# Patient Record
Sex: Female | Born: 1990
Health system: Southern US, Community
[De-identification: ages and names within clinical notes are randomized; demographics above are authoritative.]

## PROBLEM LIST (undated history)

## (undated) DIAGNOSIS — B009 Herpesviral infection, unspecified: Secondary | ICD-10-CM

## (undated) DIAGNOSIS — R011 Cardiac murmur, unspecified: Secondary | ICD-10-CM

## (undated) DIAGNOSIS — N946 Dysmenorrhea, unspecified: Secondary | ICD-10-CM

## (undated) DIAGNOSIS — F419 Anxiety disorder, unspecified: Secondary | ICD-10-CM

## (undated) DIAGNOSIS — D259 Leiomyoma of uterus, unspecified: Secondary | ICD-10-CM

## (undated) DIAGNOSIS — N939 Abnormal uterine and vaginal bleeding, unspecified: Secondary | ICD-10-CM

## (undated) DIAGNOSIS — R7989 Other specified abnormal findings of blood chemistry: Secondary | ICD-10-CM

## (undated) DIAGNOSIS — F32A Depression, unspecified: Secondary | ICD-10-CM

## (undated) DIAGNOSIS — F329 Major depressive disorder, single episode, unspecified: Secondary | ICD-10-CM

## (undated) DIAGNOSIS — R17 Unspecified jaundice: Secondary | ICD-10-CM

## (undated) HISTORY — DX: Dysmenorrhea, unspecified: N94.6

## (undated) HISTORY — DX: Major depressive disorder, single episode, unspecified: F32.9

## (undated) HISTORY — DX: Other specified abnormal findings of blood chemistry: R79.89

## (undated) HISTORY — DX: Abnormal uterine and vaginal bleeding, unspecified: N93.9

## (undated) HISTORY — DX: Depression, unspecified: F32.A

## (undated) HISTORY — PX: NO PAST SURGERIES: SHX2092

## (undated) HISTORY — DX: Herpesviral infection, unspecified: B00.9

## (undated) HISTORY — DX: Unspecified jaundice: R17

## (undated) HISTORY — DX: Cardiac murmur, unspecified: R01.1

## (undated) HISTORY — DX: Anxiety disorder, unspecified: F41.9

## (undated) HISTORY — DX: Leiomyoma of uterus, unspecified: D25.9

---

## 2018-01-19 ENCOUNTER — Ambulatory Visit (INDEPENDENT_AMBULATORY_CARE_PROVIDER_SITE_OTHER): Payer: BLUE CROSS/BLUE SHIELD | Admitting: Obstetrics and Gynecology

## 2018-01-19 ENCOUNTER — Other Ambulatory Visit: Payer: Self-pay

## 2018-01-19 ENCOUNTER — Encounter: Payer: Self-pay | Admitting: Obstetrics and Gynecology

## 2018-01-19 VITALS — BP 108/70 | HR 84 | Resp 18 | Ht 67.0 in | Wt 134.4 lb

## 2018-01-19 DIAGNOSIS — Z113 Encounter for screening for infections with a predominantly sexual mode of transmission: Secondary | ICD-10-CM

## 2018-01-19 DIAGNOSIS — N946 Dysmenorrhea, unspecified: Secondary | ICD-10-CM

## 2018-01-19 DIAGNOSIS — N941 Unspecified dyspareunia: Secondary | ICD-10-CM | POA: Diagnosis not present

## 2018-01-19 DIAGNOSIS — Z01419 Encounter for gynecological examination (general) (routine) without abnormal findings: Secondary | ICD-10-CM | POA: Diagnosis not present

## 2018-01-19 LAB — POCT URINE PREGNANCY: Preg Test, Ur: NEGATIVE

## 2018-01-19 MED ORDER — NORETHIN ACE-ETH ESTRAD-FE 1-20 MG-MCG PO TABS: 1.0000 | ORAL_TABLET | Freq: Every day | ORAL | 3 refills | Status: DC

## 2018-01-19 NOTE — Patient Instructions (Signed)
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
## 2018-01-19 NOTE — Addendum Note (Signed)
Addended by: Yates Decamp on: 01/19/2018 09:47 AM   Modules accepted: Orders

## 2018-01-22 LAB — HEP, RPR, HIV PANEL
HEP B S AG: NEGATIVE
HIV SCREEN 4TH GENERATION: NONREACTIVE
RPR Ser Ql: NONREACTIVE

## 2018-01-22 LAB — HSV(HERPES SIMPLEX VRS) I + II AB-IGM

## 2018-01-22 LAB — HEPATITIS C ANTIBODY

## 2018-01-24 ENCOUNTER — Telehealth: Payer: Self-pay | Admitting: Obstetrics and Gynecology

## 2018-01-24 NOTE — Telephone Encounter (Signed)
Call placed to convey benefits UNABLE to leave message voicemail not working.

## 2018-01-24 NOTE — Telephone Encounter (Signed)
Ok to wait until next year to do the pelvic ultrasound which is being done for pain reasons.  Patient choice.

## 2018-01-24 NOTE — Telephone Encounter (Signed)
Spoke with patient in regards to ultrasound benefits. Patient is scheduled for pap on 01/31/18. Patient has requested to defer ultrasound until after the new year. Routing to Dr. Quincy Simmonds.

## 2018-01-31 ENCOUNTER — Ambulatory Visit: Payer: BLUE CROSS/BLUE SHIELD | Admitting: Obstetrics and Gynecology

## 2018-02-01 ENCOUNTER — Telehealth: Payer: Self-pay | Admitting: *Deleted

## 2018-02-01 NOTE — Telephone Encounter (Signed)
-----   Message from Nunzio Cobbs, MD sent at 01/26/2018  6:38 AM EST ----- Please contact patient with results.  Testing is negative for HSV I/II IgM (meaning no recent HSV exposure), HIV, hep B and C. I have asked the lab if HSV I and II IgG testing can be done which would check for prior infection to these.

## 2018-02-01 NOTE — Telephone Encounter (Signed)
Notes recorded by Burnice Logan, RN on 02/01/2018 at 9:13 AM EST Left message to call Sharee Pimple, RN at [facility].    Patient is returning for pap on 02/07/18, can have HSV labs drawn at that time. ------  Notes recorded by Burnice Logan, RN on 01/30/2018 at 2:26 PM EST Confirmed with lab, unable to add labs. Message to Dr. Quincy Simmonds to advise.

## 2018-02-01 NOTE — Telephone Encounter (Signed)
Patient returning Jill's call.  °

## 2018-02-01 NOTE — Telephone Encounter (Signed)
Spoke with patient and advised of results.  Unable to process other labs for HSV IgG testing.  She is coming for pap on 11/27 and aware can have blood work drawn then.  Pt agreeable.  Encounter closed.

## 2018-02-07 ENCOUNTER — Other Ambulatory Visit: Payer: Self-pay

## 2018-02-07 ENCOUNTER — Encounter: Payer: Self-pay | Admitting: Obstetrics and Gynecology

## 2018-02-07 ENCOUNTER — Ambulatory Visit (INDEPENDENT_AMBULATORY_CARE_PROVIDER_SITE_OTHER): Payer: BLUE CROSS/BLUE SHIELD | Admitting: Obstetrics and Gynecology

## 2018-02-07 ENCOUNTER — Other Ambulatory Visit (HOSPITAL_COMMUNITY)
Admission: RE | Admit: 2018-02-07 | Discharge: 2018-02-07 | Disposition: A | Discharge status: Home / Self Care | Payer: BLUE CROSS/BLUE SHIELD | Source: Ambulatory Visit | Attending: Obstetrics and Gynecology | Admitting: Obstetrics and Gynecology

## 2018-02-07 ENCOUNTER — Other Ambulatory Visit: Payer: Self-pay | Admitting: Obstetrics and Gynecology

## 2018-02-07 VITALS — BP 120/80 | HR 80 | Ht 67.0 in | Wt 136.0 lb

## 2018-02-07 DIAGNOSIS — Z124 Encounter for screening for malignant neoplasm of cervix: Secondary | ICD-10-CM

## 2018-02-07 DIAGNOSIS — Z113 Encounter for screening for infections with a predominantly sexual mode of transmission: Secondary | ICD-10-CM | POA: Diagnosis not present

## 2018-02-07 DIAGNOSIS — N760 Acute vaginitis: Secondary | ICD-10-CM | POA: Insufficient documentation

## 2018-02-07 NOTE — Progress Notes (Signed)
GYNECOLOGY  VISIT   HPI: 27 y.o.   Single  African American  female   [place] with Patient's last menstrual period was 01/24/2018 (approximate).   here for pap smear and labs.   Pap not done at annual exam due to her menstrual bleeding.  Concerned about yeast infection.  States she has this tendency.  Some vulvar irritation with toilet tissue at work.   Just started her birth control pills last Sunday.  Emotions fluctuating.  HA last week, resolved.   Hx HSV 2 in past but patient would like confirmation of this.  IgG testing was not done from her serum STD testing at her annual exam.  GYNECOLOGIC HISTORY: Patient's last menstrual period was 01/24/2018 (approximate). Contraception: OCPs Menopausal hormone therapy:  n/a Last mammogram:  n/a Last pap smear:   2017/2018 normal per patient        OB History    Gravida  0   Para  0   Term  0   Preterm  0   AB  0   Living  0     SAB  0   TAB  0   Ectopic  0   Multiple  0   Live Births  0              There are no active problems to display for this patient.   Past Medical History:  Diagnosis Date  . Abnormal uterine bleeding   . Anxiety   . Depression   . Dysmenorrhea   . Heart murmur     No past surgical history on file.  Current Outpatient Medications  Medication Sig Dispense Refill  . norethindrone-ethinyl estradiol (JUNEL FE,GILDESS FE,LOESTRIN FE) 1-20 MG-MCG tablet Take 1 tablet by mouth daily. 3 Package 3   No current facility-administered medications for this visit.      ALLERGIES: Lactose intolerance (gi)  Family History  Problem Relation Age of Onset  . Hypertension Father     Social History   Socioeconomic History  . Marital status: Single    Spouse name: Not on file  . Number of children: Not on file  . Years of education: Not on file  . Highest education level: Not on file  Occupational History  . Not on file  Social Needs  . Financial resource strain: Not on file  .  Food insecurity:    Worry: Not on file    Inability: Not on file  . Transportation needs:    Medical: Not on file    Non-medical: Not on file  Tobacco Use  . Smoking status: Never Smoker  . Smokeless tobacco: Never Used  Substance and Sexual Activity  . Alcohol use: Yes    Comment: occ glass of wine  . Drug use: Never  . Sexual activity: Yes    Birth control/protection: Pill    Comment: Orthotricyclen Lo  Lifestyle  . Physical activity:    Days per week: Not on file    Minutes per session: Not on file  . Stress: Not on file  Relationships  . Social connections:    Talks on phone: Not on file    Gets together: Not on file    Attends religious service: Not on file    Active member of club or organization: Not on file    Attends meetings of clubs or organizations: Not on file    Relationship status: Not on file  . Intimate partner violence:    Fear of current or  ex partner: Not on file    Emotionally abused: Not on file    Physically abused: Not on file    Forced sexual activity: Not on file  Other Topics Concern  . Not on file  Social History Narrative  . Not on file    Review of Systems  Gastrointestinal: Positive for constipation.  Neurological: Positive for headaches.  All other systems reviewed and are negative.   PHYSICAL EXAMINATION:    BP 120/80 (BP Location: Right Arm, Patient Position: Sitting, Cuff Size: Normal)   Pulse 80   Ht 5\' 7"  (1.702 m)   Wt 136 lb (61.7 kg)   LMP 01/24/2018 (Approximate)   BMI 21.30 kg/m     General appearance: alert, cooperative and appears stated age  Pelvic: External genitalia:  no lesions              Urethra:  normal appearing urethra with no masses, tenderness or lesions              Bartholins and Skenes: normal                 Vagina: normal appearing vagina with normal color and discharge, no lesions              Cervix: no lesions.  Pap collected.                Bimanual Exam:  Uterus:  normal size, contour,  position, consistency, mobility, non-tender              Adnexa: no mass, fullness, tenderness                 Chaperone was present for exam.  ASSESSMENT  Vulvovaginitis.  Cervical cancer screening.  STD screening for HSV I and II IgG.  Newly started on Loestrin 1/20 to lower her estrogen dosage due to nausea on prior COC.   PLAN  Pap and HPV reflex testing.  GC/CT/trich/BV, yeast testing from pap.  HSV I and II IgG testing.  If symptoms of HA and emotional changes persist, may switch to a progesterone only contraceptive or a nonhormonal method.   An After Visit Summary was printed and given to the patient.  __15____ minutes face to face time of which over 50% was spent in counseling.

## 2018-02-09 ENCOUNTER — Encounter: Payer: Self-pay | Admitting: Obstetrics and Gynecology

## 2018-02-09 LAB — HSV(HERPES SIMPLEX VRS) I + II AB-IGG
HSV 1 GLYCOPROTEIN G AB, IGG: 13.5 {index} — AB (ref 0.00–0.90)
HSV 2 IgG, Type Spec: <0.91 index (ref 0.00–0.90)

## 2018-02-13 LAB — CYTOLOGY - PAP
Bacterial vaginitis: NEGATIVE
CANDIDA VAGINITIS: POSITIVE — AB
Chlamydia: NEGATIVE
DIAGNOSIS: NEGATIVE
Neisseria Gonorrhea: NEGATIVE
Trichomonas: NEGATIVE

## 2018-02-19 ENCOUNTER — Telehealth: Payer: Self-pay

## 2018-02-19 MED ORDER — FLUCONAZOLE 150 MG PO TABS: ORAL_TABLET | ORAL | 0 refills | Status: DC

## 2018-02-19 NOTE — Telephone Encounter (Signed)
-----   Message from Nunzio Cobbs, MD sent at 02/16/2018 10:26 AM EST ----- Please report results of pap showing normal calls.  Pap recall - 02.  Her pap did show yeast.  She may treat with Diflucan 150 mg po x 1, repeat in 72 hours if needed.  Disp:  2, RF none.   Testing is negative for BV, trichomonas, GC, and CT.   I am highlighting this result so you know to contact the patient.

## 2018-02-19 NOTE — Telephone Encounter (Signed)
Called patient to discuss normal pap results. It did reveal yeast on pap and offered Diflucan to patient. Sent to pharmacy. Diflucan 150mg  #2, 0R. Take 1po and repeat in 72hrs if needed.

## 2018-03-26 ENCOUNTER — Inpatient Hospital Stay (HOSPITAL_COMMUNITY)
Admission: AD | Admit: 2018-03-26 | Discharge: 2018-03-26 | Disposition: A | Discharge status: Home / Self Care | Payer: BLUE CROSS/BLUE SHIELD | Attending: Obstetrics and Gynecology | Admitting: Obstetrics and Gynecology

## 2018-03-26 ENCOUNTER — Encounter (HOSPITAL_COMMUNITY): Payer: Self-pay

## 2018-03-26 ENCOUNTER — Telehealth: Payer: Self-pay | Admitting: Obstetrics and Gynecology

## 2018-03-26 DIAGNOSIS — N946 Dysmenorrhea, unspecified: Secondary | ICD-10-CM | POA: Insufficient documentation

## 2018-03-26 LAB — CBC
HCT: 36.9 % (ref 36.0–46.0)
Hemoglobin: 12.6 g/dL (ref 12.0–15.0)
MCH: 33.4 pg (ref 26.0–34.0)
MCHC: 34.1 g/dL (ref 30.0–36.0)
MCV: 97.9 fL (ref 80.0–100.0)
Platelets: 212 10*3/uL (ref 150–400)
RBC: 3.77 MIL/uL — ABNORMAL LOW (ref 3.87–5.11)
RDW: 11.7 % (ref 11.5–15.5)
WBC: 10.4 10*3/uL (ref 4.0–10.5)
nRBC: 0 % (ref 0.0–0.2)

## 2018-03-26 LAB — URINALYSIS, ROUTINE W REFLEX MICROSCOPIC
BACTERIA UA: NONE SEEN
BILIRUBIN URINE: NEGATIVE
Glucose, UA: NEGATIVE mg/dL
Ketones, ur: NEGATIVE mg/dL
LEUKOCYTES UA: NEGATIVE
NITRITE: NEGATIVE
PROTEIN: 30 mg/dL — AB
Specific Gravity, Urine: 1.023 (ref 1.005–1.030)
pH: 5 (ref 5.0–8.0)

## 2018-03-26 LAB — BASIC METABOLIC PANEL WITH GFR
Anion gap: 6 (ref 5–15)
BUN: 7 mg/dL (ref 6–20)
CO2: 23 mmol/L (ref 22–32)
Calcium: 8.8 mg/dL — ABNORMAL LOW (ref 8.9–10.3)
Chloride: 110 mmol/L (ref 98–111)
Creatinine, Ser: 0.75 mg/dL (ref 0.44–1.00)
GFR calc Af Amer: >60 mL/min
GFR calc non Af Amer: >60 mL/min
Glucose, Bld: 98 mg/dL (ref 70–99)
Potassium: 4.5 mmol/L (ref 3.5–5.1)
Sodium: 139 mmol/L (ref 135–145)

## 2018-03-26 LAB — WET PREP, GENITAL
Clue Cells Wet Prep HPF POC: NONE SEEN
Sperm: NONE SEEN
Trich, Wet Prep: NONE SEEN
WBC, Wet Prep HPF POC: NONE SEEN
Yeast Wet Prep HPF POC: NONE SEEN

## 2018-03-26 LAB — POCT PREGNANCY, URINE: Preg Test, Ur: NEGATIVE

## 2018-03-26 MED ORDER — MORPHINE SULFATE (PF) 4 MG/ML IV SOLN
6.0000 mg | Freq: Once | INTRAVENOUS | Status: AC
  Administered 2018-03-26: 6 mg via INTRAMUSCULAR
  Filled 2018-03-26: qty 2

## 2018-03-26 MED ORDER — ONDANSETRON 4 MG PO TBDP
4.0000 mg | ORAL_TABLET | Freq: Once | ORAL | Status: AC
  Administered 2018-03-26: 4 mg via ORAL
  Filled 2018-03-26: qty 1

## 2018-03-26 MED ORDER — ACETAMINOPHEN 325 MG PO TABS: ORAL_TABLET | ORAL | Status: DC

## 2018-03-26 MED ORDER — IBUPROFEN 800 MG PO TABS: 800.0000 mg | ORAL_TABLET | Freq: Three times a day (TID) | ORAL | 0 refills | Status: DC | PRN

## 2018-03-26 MED ORDER — MORPHINE SULFATE (PF) 4 MG/ML IV SOLN: 8.0000 mg | Freq: Once | INTRAVENOUS | Status: DC

## 2018-03-26 NOTE — Telephone Encounter (Signed)
I agree with hospital evaluation.

## 2018-03-26 NOTE — MAU Provider Note (Signed)
History    CSN: [id number] Arrival date and time: 03/26/18 1239 First Provider Initiated Contact with Patient 03/26/18 1338    Chief Complaint  Patient presents with  . Abdominal Pain  . Vaginal Bleeding   HPI 28yo G0P0 who presents with painful periods. States her period started yesterday, and she has been having pain for last couple of days. States she has monthly periods but always painful. She states she was let go from her last job because missed so much work each month because of period pain. States has been taking birth control pills and ibuprofen prior to periods for years without improvement. Thinks she had an ultrasound for this about 5 years ago but doesn't remember what was said. Reports taking 800mg  ibuprofen every 1 hour for last 4 hours (3200mg ) because of pain. Feels like she is going to die. States she can't do anything. Wondering if it's her diet. Pain is mostly in the middle, does not radiate. No back pain, pain with urination.   Sister has similar problem and states higher dose ibuprofen (prescription strength) works better for her. Is wondering if her sister needs oxycodone more long term.   OB History    Gravida  0   Para  0   Term  0   Preterm  0   AB  0   Living  0     SAB  0   TAB  0   Ectopic  0   Multiple  0   Live Births  0           Past Medical History:  Diagnosis Date  . Abnormal uterine bleeding   . Anxiety   . Depression   . Dysmenorrhea   . Heart murmur   . HSV-1 (herpes simplex virus 1) infection     History reviewed. No pertinent surgical history.  Family History  Problem Relation Age of Onset  . Hypertension Father     Social History   Tobacco Use  . Smoking status: Never Smoker  . Smokeless tobacco: Never Used  Substance Use Topics  . Alcohol use: Yes    Comment: occ glass of wine  . Drug use: Never    Allergies:  Allergies  Allergen Reactions  . Lactose Intolerance (Gi)     Medications Prior to  Admission  Medication Sig Dispense Refill Last Dose  . fluconazole (DIFLUCAN) 150 MG tablet Take 1 tablet by mouth and repeat in 72 hours if not resolved. 2 tablet 0   . norethindrone-ethinyl estradiol (JUNEL FE,GILDESS FE,LOESTRIN FE) 1-20 MG-MCG tablet Take 1 tablet by mouth daily. 3 Package 3 Taking    Review of Systems  Constitutional: Positive for activity change, appetite change and fatigue. Negative for fever.  Respiratory: Negative for shortness of breath.   Cardiovascular: Negative for chest pain.  Gastrointestinal: Positive for abdominal pain, diarrhea, nausea and vomiting. Negative for blood in stool and constipation.  Genitourinary: Positive for menstrual problem, pelvic pain and vaginal bleeding. Negative for dysuria, vaginal discharge and vaginal pain.  Musculoskeletal: Negative for back pain.  Neurological: Positive for light-headedness. Negative for dizziness and headaches.  Psychiatric/Behavioral: Positive for sleep disturbance. The patient is nervous/anxious.    Physical Exam   Blood pressure 125/77, pulse 87, temperature 98.4 F (36.9 C), temperature source Oral, resp. rate 18, weight 61.7 kg, last menstrual period 03/25/2018, SpO2 99 %.  Physical Exam  Nursing note and vitals reviewed. Constitutional: She is oriented to person, place, and time. She appears well-developed  and well-nourished. No distress.  HENT:  Head: Normocephalic and atraumatic.  Eyes: Conjunctivae and EOM are normal. No scleral icterus.  Neck: Neck supple. No thyromegaly present.  Cardiovascular: Normal rate, regular rhythm, normal heart sounds and intact distal pulses.  No murmur heard. Respiratory: Effort normal and breath sounds normal. She has no wheezes.  GI: Soft. Bowel sounds are normal. There is no abdominal tenderness. There is no guarding.  Genitourinary:    Genitourinary Comments: Normal appearing labia, normal vaginal mucosa with blood pooling (1 small 2cm clot), normal appearing  cervix with blood coming from closed os  no CMT, some fundal tenderness with bimanual exam    Musculoskeletal:        General: No edema.  Neurological: She is alert and oriented to person, place, and time.  Skin: Skin is warm and dry. No rash noted.  Psychiatric: She has a normal mood and affect. Her behavior is normal.   MAU Course  Procedures  MDM -- given IM morphine and zofran for nausea and pain -- CBC and BMP checked given bleeding and higher doses of ibuprofen - normal kidney function, no evidence for anemia -- UPT negative, wet prep negative, G/C pending   Assessment and Plan  28yo G0P0 with dysmenorrhea. Treated acute pain and advised that opioids were not a long term solution to pain. Recommended outpatient ultrasound to evaluate for possible anatomical abnormalities contributing to symptoms. Encouraged follow-up with primary GYN provider in next 2 weeks for further work-up and treatment of dysmenorrhea. Counseled on proper dose and use of ibuprofen, recommended alternating with Tylenol. Patient reported symptoms much improved after morphine. All questions answered and return precautions reviewed prior to discharge.   Glenice Bow, DO 03/26/2018, 1:59 PM

## 2018-03-26 NOTE — Telephone Encounter (Signed)
Call placed to patient to scheduled ultrasound, patient had requested to defer until January 2020. In speaking with the patient she advises she is having excruciating menstrual pain and is considering going to the emergency room. Call immediately transferred to Triage Nurse  Routing to Triage Nurse

## 2018-03-26 NOTE — Discharge Instructions (Signed)
·   You should receive a call to schedule an ultrasound to evaluate for any physical/anatomical causes of your pain.  Ask your doctor about other causes of painful periods.

## 2018-03-26 NOTE — MAU Note (Signed)
Pt states she's been having severe cramps that have happened before, pain 10/10. Having large blood clots (quarter size). Soaking a pad in 1-2 hours.

## 2018-03-26 NOTE — Telephone Encounter (Signed)
Spoke with patient at time of call. Patient states that she started her menses on 03/25/2018. Woke up this morning with severe pelvic cramping 10/10 pain. Reports having painful cycles, but states pain is more severe than usual. Has taken Ibuprofen 800 mg at 7 am and 11 am without relief. States she is changing her pad every 2-3 hours. Passing quarter sized blood clots. Has been using a heating pad as well with little relief. Patient reports having nausea and is light headed. Denies any fever or chills. Patient states "I feel like I am in labor. I feel like I am going to die. I am in so much pain." Advised patient she will need to be seen for immediate evaluation. Patient's sister lives close by and patient states she is coming to get her to take her to the ER. Patient is asking where she should go for evaluation. Advised patient to be seen at [facility] MAU for further evaluation. Patient verbalizes understanding and will have her sister take her at this time. Advised will notify Dr.Silva and return call with any additional recommendations. Patient verbalizes understanding.

## 2018-03-27 LAB — GC/CHLAMYDIA PROBE AMP (~~LOC~~) NOT AT ARMC
Chlamydia: NEGATIVE
NEISSERIA GONORRHEA: NEGATIVE

## 2018-03-28 ENCOUNTER — Telehealth: Payer: Self-pay | Admitting: Obstetrics and Gynecology

## 2018-03-28 DIAGNOSIS — N946 Dysmenorrhea, unspecified: Secondary | ICD-10-CM

## 2018-03-28 NOTE — Telephone Encounter (Signed)
Left message to call Dannie Woolen, RN at [facility] [phone].   

## 2018-03-28 NOTE — Telephone Encounter (Signed)
Please contact patient in follow up to her MAU visit at [facility] this week for pelvic pain.   I had previously recommend a pelvic ultrasound for further evaluation.  May need to be re-precerted in this new calendar year of 2020.  We can schedule this for her at the office or at [facility] or [facility] as an outpatient.  She will need a follow up visit with me after the ultrasound.

## 2018-03-29 NOTE — Telephone Encounter (Signed)
Referral placed to [facility].   Call placed to patient, left detailed message, ok per dpr. Advised referral has been placed to [facility]. Our referral coordinator will f/u with appt details once scheduled. Return call to office if any additional questions.    Routing to [employer] .   Cc: Dr. Quincy Simmonds, [place] L[place]  Encounter closed.

## 2018-03-29 NOTE — Telephone Encounter (Signed)
Spoke with patient in regards to scheduling questions. Answered all questions regarding appointment options. Patient is requesting, at this time to be evaluated at the [facility].  Advised I will forward this information to our Triage Nurse for scheduling assistance  Routing to Triage Nurse  cc: Lamont Snowball, RN

## 2018-03-29 NOTE — Telephone Encounter (Signed)
Spoke with patient. Patient has additional questions for business office prior to scheduling PUS. Routing to S. [place] for return call.

## 2018-04-09 ENCOUNTER — Ambulatory Visit (HOSPITAL_COMMUNITY)
Admission: RE | Admit: 2018-04-09 | Discharge: 2018-04-09 | Disposition: A | Discharge status: Home / Self Care | Payer: Self-pay | Source: Ambulatory Visit | Attending: Family Medicine | Admitting: Family Medicine

## 2018-04-09 DIAGNOSIS — N946 Dysmenorrhea, unspecified: Secondary | ICD-10-CM | POA: Insufficient documentation

## 2018-04-25 ENCOUNTER — Telehealth: Payer: Self-pay | Admitting: Obstetrics and Gynecology

## 2018-04-25 ENCOUNTER — Other Ambulatory Visit: Payer: Self-pay

## 2018-04-25 ENCOUNTER — Inpatient Hospital Stay (HOSPITAL_COMMUNITY)
Admission: AD | Admit: 2018-04-25 | Discharge: 2018-04-25 | Disposition: A | Discharge status: Home / Self Care | Payer: Self-pay | Attending: Obstetrics & Gynecology | Admitting: Obstetrics & Gynecology

## 2018-04-25 ENCOUNTER — Encounter (HOSPITAL_COMMUNITY): Payer: Self-pay | Admitting: *Deleted

## 2018-04-25 DIAGNOSIS — N946 Dysmenorrhea, unspecified: Secondary | ICD-10-CM

## 2018-04-25 LAB — CBC
HCT: 39.3 % (ref 36.0–46.0)
Hemoglobin: 13.1 g/dL (ref 12.0–15.0)
MCH: 33.7 pg (ref 26.0–34.0)
MCHC: 33.3 g/dL (ref 30.0–36.0)
MCV: 101 fL — ABNORMAL HIGH (ref 80.0–100.0)
Platelets: 233 10*3/uL (ref 150–400)
RBC: 3.89 MIL/uL (ref 3.87–5.11)
RDW: 12.2 % (ref 11.5–15.5)
WBC: 8.5 10*3/uL (ref 4.0–10.5)
nRBC: 0 % (ref 0.0–0.2)

## 2018-04-25 LAB — URINALYSIS, ROUTINE W REFLEX MICROSCOPIC
Bilirubin Urine: NEGATIVE
Glucose, UA: NEGATIVE mg/dL
Ketones, ur: NEGATIVE mg/dL
Leukocytes,Ua: NEGATIVE
Nitrite: NEGATIVE
Protein, ur: NEGATIVE mg/dL
Specific Gravity, Urine: 1.02 (ref 1.005–1.030)
pH: 6.5 (ref 5.0–8.0)

## 2018-04-25 LAB — URINALYSIS, MICROSCOPIC (REFLEX)

## 2018-04-25 LAB — POCT PREGNANCY, URINE: PREG TEST UR: NEGATIVE

## 2018-04-25 MED ORDER — TRAMADOL HCL 50 MG PO TABS: 50.0000 mg | ORAL_TABLET | Freq: Four times a day (QID) | ORAL | 0 refills | Status: DC | PRN

## 2018-04-25 MED ORDER — MORPHINE SULFATE (PF) 4 MG/ML IV SOLN
4.0000 mg | Freq: Once | INTRAVENOUS | Status: AC
  Administered 2018-04-25: 4 mg via INTRAMUSCULAR
  Filled 2018-04-25: qty 1

## 2018-04-25 MED ORDER — PROMETHAZINE HCL 25 MG/ML IJ SOLN
12.5000 mg | Freq: Four times a day (QID) | INTRAMUSCULAR | Status: DC | PRN
  Administered 2018-04-25: 12.5 mg via INTRAMUSCULAR
  Filled 2018-04-25: qty 1

## 2018-04-25 MED ORDER — NORGESTIMATE-ETH ESTRADIOL 0.25-35 MG-MCG PO TABS: 1.0000 | ORAL_TABLET | Freq: Every day | ORAL | 0 refills | Status: DC

## 2018-04-25 NOTE — MAU Provider Note (Signed)
History     CSN: [id number]  Arrival date and time: 04/25/18 8841   First Provider Initiated Contact with Patient 04/25/18 1919     28 y.o. non-pregnant female presenting with menstrual pain. Menses started today at which time the pain started. Bleeding is normal in amount, passing small clots. She took Ibuprofen 800 mg about 6 times today. Describes pain as cramping in abdomen and pelvis. Rates pain 10/10. This has been an ongoing problem for her. She is taking OCPs. She also started Ibuprofen 1 week prior to menses.   Past Medical History:  Diagnosis Date  . Abnormal uterine bleeding   . Anxiety   . Depression   . Dysmenorrhea   . Heart murmur   . HSV-1 (herpes simplex virus 1) infection     Past Surgical History:  Procedure Laterality Date  . NO PAST SURGERIES      Family History  Problem Relation Age of Onset  . Hypertension Father     Social History   Tobacco Use  . Smoking status: Never Smoker  . Smokeless tobacco: Never Used  Substance Use Topics  . Alcohol use: Yes    Comment: occ glass of wine  . Drug use: Never    Allergies:  Allergies  Allergen Reactions  . Lactose Intolerance (Gi)     Medications Prior to Admission  Medication Sig Dispense Refill Last Dose  . acetaminophen (TYLENOL) 325 MG tablet Take 500-650mg  (1 extra strength or 2 regular strength) tablets every 4-6 hours as needed for pain.     Marland Kitchen ibuprofen (ADVIL,MOTRIN) 800 MG tablet Take 1 tablet (800 mg total) by mouth every 8 (eight) hours as needed. 30 tablet 0   . norethindrone-ethinyl estradiol (JUNEL FE,GILDESS FE,LOESTRIN FE) 1-20 MG-MCG tablet Take 1 tablet by mouth daily. 3 Package 3 Taking    Review of Systems  Constitutional: Negative for chills and fever.  Gastrointestinal: Positive for abdominal pain, diarrhea and nausea.  Genitourinary: Positive for pelvic pain and vaginal bleeding.   Physical Exam   Blood pressure 134/86, pulse 80, temperature 97.7 F (36.5 C),  temperature source Oral, resp. rate 20, height 5' 7.5" (1.715 m), weight 63.5 kg, last menstrual period 04/25/2018, SpO2 100 %.  Physical Exam  Nursing note and vitals reviewed. Constitutional: She is oriented to person, place, and time. She appears well-developed and well-nourished. No distress.  HENT:  Head: Normocephalic and atraumatic.  Neck: Normal range of motion.  Cardiovascular: Normal rate.  Respiratory: Effort normal. No respiratory distress.  GI: Soft. She exhibits no distension and no mass. There is no abdominal tenderness. There is no rebound and no guarding.  Genitourinary:    Genitourinary Comments: External: no lesions or erythema Vagina: rugated, pink, moist, small bloody discharge Uterus: non enlarged, anteverted, non tender, no CMT Adnexae: no masses, no tenderness left, no tenderness right Cervix normal, nulli    Musculoskeletal: Normal range of motion.  Neurological: She is alert and oriented to person, place, and time.  Skin: Skin is warm and dry.  Psychiatric:  teary   Results for orders placed or performed during the hospital encounter of 04/25/18 (from the past 24 hour(s))  Pregnancy, urine POC     Status: None   Collection Time: 04/25/18  6:57 PM  Result Value Ref Range   Preg Test, Ur NEGATIVE NEGATIVE  Urinalysis, Routine w reflex microscopic     Status: Abnormal   Collection Time: 04/25/18  7:01 PM  Result Value Ref Range   Color, Urine  YELLOW YELLOW   APPearance CLEAR CLEAR   Specific Gravity, Urine 1.020 1.005 - 1.030   pH 6.5 5.0 - 8.0   Glucose, UA NEGATIVE NEGATIVE mg/dL   Hgb urine dipstick LARGE (A) NEGATIVE   Bilirubin Urine NEGATIVE NEGATIVE   Ketones, ur NEGATIVE NEGATIVE mg/dL   Protein, ur NEGATIVE NEGATIVE mg/dL   Nitrite NEGATIVE NEGATIVE   Leukocytes,Ua NEGATIVE NEGATIVE  Urinalysis, Microscopic (reflex)     Status: Abnormal   Collection Time: 04/25/18  7:01 PM  Result Value Ref Range   RBC / HPF 21-50 0 - 5 RBC/hpf   WBC, UA  0-5 0 - 5 WBC/hpf   Bacteria, UA RARE (A) NONE SEEN   Squamous Epithelial / LPF 0-5 0 - 5  CBC     Status: Abnormal   Collection Time: 04/25/18  7:40 PM  Result Value Ref Range   WBC 8.5 4.0 - 10.5 K/uL   RBC 3.89 3.87 - 5.11 MIL/uL   Hemoglobin 13.1 12.0 - 15.0 g/dL   HCT 39.3 36.0 - 46.0 %   MCV 101.0 (H) 80.0 - 100.0 fL   MCH 33.7 26.0 - 34.0 pg   MCHC 33.3 30.0 - 36.0 g/dL   RDW 12.2 11.5 - 15.5 %   Platelets 233 150 - 400 K/uL   nRBC 0.0 0.0 - 0.2 %   MAU Course  Procedures Orders Placed This Encounter  Procedures  . Urinalysis, Routine w reflex microscopic    Standing Status:   Standing    Number of Occurrences:   1  . CBC    Standing Status:   Standing    Number of Occurrences:   1  . Urinalysis, Microscopic (reflex)    Standing Status:   Standing    Number of Occurrences:   1  . Pregnancy, urine POC    Standing Status:   Standing    Number of Occurrences:   1  . Discharge patient    Order Specific Question:   Discharge disposition    Answer:   01-Home or Self Care [1]    Order Specific Question:   Discharge patient date    Answer:   04/25/2018   Meds ordered this encounter  Medications  . morphine 4 MG/ML injection 4 mg  . promethazine (PHENERGAN) injection 12.5 mg  . traMADol (ULTRAM) 50 MG tablet    Sig: Take 1 tablet (50 mg total) by mouth every 6 (six) hours as needed.    Dispense:  20 tablet    Refill:  0    Order Specific Question:   Supervising Provider    Answer:   Verita Schneiders A [[id number]]  . norgestimate-ethinyl estradiol (ORTHO-CYCLEN,SPRINTEC,PREVIFEM) 0.25-35 MG-MCG tablet    Sig: Take 1 tablet by mouth daily.    Dispense:  3 Package    Refill:  0    Order Specific Question:   Supervising Provider    Answer:   Verita Schneiders A [[id number]]   MDM Labs ordered and reviewed. Pelvic cultures negative last month, not repeated today. Recent pelvic US showed small fibroids. Pain improved after meds. Recommend continuous OCPs. Stable for discharge  home.  Assessment and Plan   1. Dysmenorrhea    Discharge home Follow up with Dr. Roselie Awkward as scheduled this month Rx Ultram Rx Sprintec  Allergies as of 04/25/2018      Reactions   Lactose Intolerance (gi)       Medication List    STOP taking these medications   norethindrone-ethinyl estradiol  1-20 MG-MCG tablet Commonly known as:  JUNEL FE,GILDESS FE,LOESTRIN FE     TAKE these medications   acetaminophen 325 MG tablet Commonly known as:  TYLENOL Take 500-650mg  (1 extra strength or 2 regular strength) tablets every 4-6 hours as needed for pain.   ibuprofen 800 MG tablet Commonly known as:  ADVIL,MOTRIN Take 1 tablet (800 mg total) by mouth every 8 (eight) hours as needed.   norgestimate-ethinyl estradiol 0.25-35 MG-MCG tablet Commonly known as:  ORTHO-CYCLEN,SPRINTEC,PREVIFEM Take 1 tablet by mouth daily.   traMADol 50 MG tablet Commonly known as:  ULTRAM Take 1 tablet (50 mg total) by mouth every 6 (six) hours as needed.      Julianne Handler, CNM 04/25/2018, 8:59 PM

## 2018-04-25 NOTE — Telephone Encounter (Signed)
Patient called and requested to speak with the nurse. She said she had a recent ultrasound and she'd like to review the results. She said she is having painful menstrual cycles and irregular bleeding.  Last seen: 02/07/18

## 2018-04-25 NOTE — MAU Note (Addendum)
Presents with c/o menstrual cramping.  Reports has taken Ibuprofen 800mg  without any relief.  Reports has taken meds approximately 6x since 6am.  Also reports bilateral leg pain. LMP 04/25/2018 & currently taking BCP's.

## 2018-04-25 NOTE — Discharge Instructions (Signed)
Dysmenorrhea  Dysmenorrhea means painful cramps during your period (menstrual period). You will have pain in your lower belly (abdomen). The pain is caused by the tightening (contracting) of the muscles of the womb (uterus). The pain may be mild or very bad. With this condition, you may:  Have a headache.  Feel sick to your stomach (nauseous).  Throw up (vomit).  Have lower back pain.  Follow these instructions at home:  Helping pain and cramping    Put heat on your lower back or belly when you have pain or cramps. Use the heat source that your doctor tells you to use.  Place a towel between your skin and the heat.  Leave the heat on for 20-30 minutes.  Remove the heat if your skin turns bright red. This is especially important if you cannot feel pain, heat, or cold.  Do not have a heating pad on during sleep.  Do aerobic exercises. These include walking, swimming, or biking. These may help with cramps.  Massage your lower back or belly. This may help lessen pain.  General instructions  Take over-the-counter and prescription medicines only as told by your doctor.  Do not drive or use heavy machinery while taking prescription pain medicine.  Avoid alcohol and caffeine during and right before your period. These can make cramps worse.  Do not use any products that have nicotine or tobacco. These include cigarettes and e-cigarettes. If you need help quitting, ask your doctor.  Keep all follow-up visits as told by your doctor. This is important.  Contact a doctor if:  You have pain that gets worse.  You have pain that does not get better with medicine.  You have pain during sex.  You feel sick to your stomach or you throw up during your period, and medicine does not help.  Get help right away if:  You pass out (faint).  Summary  Dysmenorrhea means painful cramps during your period (menstrual period).  Put heat on your lower back or belly when you have pain or cramps.  Do exercises like walking, swimming, or biking to  help with cramps.  Contact a doctor if you have pain during sex.  This information is not intended to replace advice given to you by your health care provider. Make sure you discuss any questions you have with your health care provider.  Document Released: 05/27/2008 Document Revised: 03/17/2016 Document Reviewed: 03/17/2016  Elsevier Interactive Patient Education  2019 Elsevier Inc.

## 2018-04-25 NOTE — Telephone Encounter (Signed)
Spoke with patient. Patient requesting PUS results completed at [facility] on 04/09/18. Patient states she has been out of her OCP for 1 wk and is requesting a dose change. LMP 04/25/18.   Reviewed options of scheduling OV here with Dr. Quincy Simmonds for evaluation and consult or keep new patient appt at [facility] clinic scheduled on 05/03/18. Patient declined OV due to no insurance. Advised patient to f/u with [facility] clinic as scheduled, may contact them directly to see if earlier appt available, number provided. Patient verbalizes understanding and is agreeable.   Routing to provider for final review. Patient is agreeable to disposition. Will close encounter.

## 2018-05-03 ENCOUNTER — Encounter: Payer: Self-pay | Admitting: Obstetrics & Gynecology

## 2018-05-03 ENCOUNTER — Ambulatory Visit (INDEPENDENT_AMBULATORY_CARE_PROVIDER_SITE_OTHER): Payer: Self-pay | Admitting: Obstetrics & Gynecology

## 2018-05-03 VITALS — BP 117/79 | HR 85 | Ht 67.5 in | Wt 139.7 lb

## 2018-05-03 DIAGNOSIS — D250 Submucous leiomyoma of uterus: Secondary | ICD-10-CM

## 2018-05-03 DIAGNOSIS — N920 Excessive and frequent menstruation with regular cycle: Secondary | ICD-10-CM

## 2018-05-03 NOTE — Progress Notes (Signed)
Patient ID: Valerie Peters, female   DOB: 02-26-1991, 28 y.o.   MRN: [id number]  Chief Complaint  Patient presents with  . Gynecologic Exam  heavy period and cramps  HPI Valerie Peters is a 28 y.o. female.  [id number] Patient's last menstrual period was 04/25/2018. She was a patient of Dr. Quincy Simmonds until she lost insurance. [place] was done in January that diagnoses submucosal fibroid. She has heavy flow and severe cramps despite talking OCP. She lost her job because of missed days due to her problem. HPI  Past Medical History:  Diagnosis Date  . Abnormal uterine bleeding   . Anxiety   . Depression   . Dysmenorrhea   . Heart murmur   . HSV-1 (herpes simplex virus 1) infection     Past Surgical History:  Procedure Laterality Date  . NO PAST SURGERIES      Family History  Problem Relation Age of Onset  . Hypertension Father     Social History Social History   Tobacco Use  . Smoking status: Never Smoker  . Smokeless tobacco: Never Used  Substance Use Topics  . Alcohol use: Yes    Comment: occ glass of wine  . Drug use: Never    Allergies  Allergen Reactions  . Lactose Intolerance (Gi)     Current Outpatient Medications  Medication Sig Dispense Refill  . ibuprofen (ADVIL,MOTRIN) 800 MG tablet Take 1 tablet (800 mg total) by mouth every 8 (eight) hours as needed. 30 tablet 0  . norgestimate-ethinyl estradiol (ORTHO-CYCLEN,SPRINTEC,PREVIFEM) 0.25-35 MG-MCG tablet Take 1 tablet by mouth daily. 3 Package 0  . traMADol (ULTRAM) 50 MG tablet Take 1 tablet (50 mg total) by mouth every 6 (six) hours as needed. (Patient not taking: Reported on 05/03/2018) 20 tablet 0   No current facility-administered medications for this visit.     Review of Systems Review of Systems  Constitutional: Negative.   Gastrointestinal: Negative.   Genitourinary: Positive for menstrual problem (heavy flow. Menses last 4 days while on OCP) and pelvic pain. Negative for dysuria and vaginal discharge.  Hematuria: cramps.    Blood pressure 117/79, pulse 85, height 5' 7.5" (1.715 m), weight 139 lb 11.2 oz (63.4 kg), last menstrual period 04/25/2018.  Physical Exam Physical Exam Constitutional:      Appearance: Normal appearance.  Abdominal:     General: Abdomen is flat.  Genitourinary:    Comments: deferred Neurological:     Mental Status: She is alert.  Psychiatric:        Mood and Affect: Mood normal.        Behavior: Behavior normal.     Data Reviewed  CLINICAL DATA:  Dysmenorrhea, debilitating pain with menses monthly, heavy menses since her late teens; LMP 03/25/2018  EXAM: TRANSABDOMINAL AND TRANSVAGINAL ULTRASOUND OF PELVIS  TECHNIQUE: Both transabdominal and transvaginal ultrasound examinations of the pelvis were performed. Transabdominal technique was performed for global imaging of the pelvis including uterus, ovaries, adnexal regions, and pelvic cul-de-sac. It was necessary to proceed with endovaginal exam following the transabdominal exam to visualize the endometrial complex.  COMPARISON:  None  FINDINGS: Uterus  Measurements: 7.4 x 3.0 x 4.9 cm = volume: 108.8 mL. Retroverted. Anterior mid uterine leiomyoma 2.3 x 1.6 x 2.3 cm which extends submucosal. Several additional tiny intramural leiomyomata are noted, less than 1 cm in diameter.  Endometrium  Thickness: 6 mm.  No endometrial fluid or focal abnormality  Right ovary  Measurements: 2.3 x 1.2 x 1.5 cm = volume: 4.1 mL.  Small nonspecific calcification within RIGHT ovary 4 mm diameter. Normal morphology without mass  Left ovary  Measurements: 3.6 x 1.2 x 2.7 cm = volume: 11.7 mL. Small collapsed corpus luteum. Otherwise normal morphology without mass  Other findings  Small amount of nonspecific free pelvic fluid. No other adnexal masses.  IMPRESSION: Submucosal leiomyoma within the anterior mid uterus 2.3 cm in greatest size.  Remainder of exam shows no significant  abnormalities.   Electronically Signed   By: Lavonia Dana M.D.   On: 04/09/2018 15:07  CBC    Component Value Date/Time   WBC 8.5 04/25/2018 1940   RBC 3.89 04/25/2018 1940   HGB 13.1 04/25/2018 1940   HCT 39.3 04/25/2018 1940   PLT 233 04/25/2018 1940   MCV 101.0 (H) 04/25/2018 1940   MCH 33.7 04/25/2018 1940   MCHC 33.3 04/25/2018 1940   RDW 12.2 04/25/2018 1940    Assessment Patient Active Problem List   Diagnosis Date Noted  . Fibroids, submucosal 05/03/2018  . Menorrhagia with regular cycle 05/03/2018   Insurance was lost  Plan Financial assistance application recommended She may apply for COBRA coverage RTC for scheduling [facility] and she may need hysteroscopic myomectomy     Valerie Peters 05/03/2018, 4:18 PM

## 2018-05-03 NOTE — Patient Instructions (Signed)
Uterine Fibroids    Uterine fibroids are lumps of tissue (tumors) in your womb (uterus). They are not cancer (are benign).  Most women with this condition do not need treatment. Sometimes fibroids can affect your ability to have children (your fertility). If that happens, you may need surgery to take out the fibroids.  Follow these instructions at home:  · Take over-the-counter and prescription medicines only as told by your doctor. Your doctor may suggest NSAIDs (such as aspirin or ibuprofen) to help with pain.  · Ask your doctor if you should:  ? Take iron pills.  ? Eat more foods that have iron in them, such as dark green, leafy vegetables.  · If directed, apply heat to your back or belly to reduce pain. Use the heat source that your doctor recommends, such as a moist heat pack or a heating pad.  ? Put a towel between your skin and the heat source.  ? Leave the heat on for 20-30 minutes.  ? Remove the heat if your skin turns bright red. This is especially important if you are unable to feel pain, heat, or cold. You may have a greater risk of getting burned.  · Pay close attention to your period (menstrual) cycles. Tell your doctor about any changes, such as:  ? A heavier blood flow than usual.  ? Needing to use more pads or tampons than normal.  ? A change in how many days your period lasts.  ? A change in symptoms that come with your period, such as cramps or back pain.  · Keep all follow-up visits as told by your doctor. This is important. Your doctor may need to watch your fibroids over time for any changes.  Contact a doctor if you:  · Have pain that does not get better with medicine or heat, such as pain or cramps in:  ? Your back.  ? The area between your hip bones (pelvic area).  ? Your belly.  · Have new bleeding between your periods.  · Have more bleeding during or between your periods.  · Feel very tired or weak.  · Feel light-headed.  Get help right away if you:  · Pass out (faint).  · Have pain in the  area between your hip bones that suddenly gets worse.  · Have bleeding that soaks a tampon or pad in 30 minutes or less.  Summary  · Uterine fibroids are lumps of tissue (tumors) in your womb (uterus). They are not cancer.  · The only treatment that most women need is taking aspirin or ibuprofen for pain.  · Contact a doctor if you have pain or cramps that do not get better with medicine.  · Make sure you know what symptoms you should get help for right away.  This information is not intended to replace advice given to you by your health care provider. Make sure you discuss any questions you have with your health care provider.  Document Released: 04/02/2010 Document Revised: 01/24/2017 Document Reviewed: 01/24/2017  Elsevier Interactive Patient Education © 2019 Elsevier Inc.

## 2018-05-08 ENCOUNTER — Telehealth: Payer: Self-pay

## 2018-05-08 NOTE — Telephone Encounter (Signed)
Pt called requesting to know how much the [place] would cost for the one the Dr. Roselie Awkward recommended for her to have?

## 2018-05-08 NOTE — Telephone Encounter (Signed)
Called patient to regarding her message left on the answering machine. Per Dr.Arnold patient should apply for Financial assistance application recommended and see if she can apply for cobra coverage. No answer or voice mail.

## 2018-06-27 ENCOUNTER — Telehealth: Payer: Self-pay | Admitting: Obstetrics and Gynecology

## 2018-06-27 NOTE — Telephone Encounter (Signed)
Patient returned call

## 2018-06-27 NOTE — Telephone Encounter (Signed)
Patient is currently under the care of the teaching service GYN team.  She will need to contact them for her questions.

## 2018-06-27 NOTE — Telephone Encounter (Signed)
Left message to call Thoma Paulsen at [phone]. 

## 2018-06-27 NOTE — Telephone Encounter (Signed)
Spoke with patient. Patient was seen in the ED on 04/25/2018 due to dysmenorrhea. Patient was having cramping that was a 10/10 and passing small clots. Patient was switched from Loestrin Fe to [place] and advised to take this continuously. Patient had a menses in March that started March 13 and lasted for 2 weeks. States she had moderate cramping the first week and then no cramping the second week. Was changing her pad every 2 hours. Patient had a PUS 04/09/2018 which showed uterine fibroids. Patient would like to know if Dr.Silva feels this OCP is a good fit for her and if the fibroids seen on her [place] could lead to surgery or problems with future pregnancy.

## 2018-06-27 NOTE — Telephone Encounter (Signed)
Patient would like refill on birth control. Also has questions regarding pap results.

## 2018-06-29 NOTE — Telephone Encounter (Signed)
If patient would like to re-establish care with me, I am happy to see her for an office visit.  I will not be able to do a WebEx visit as I have not seen her in several months.  She can ask administration any questions she has regarding her insurance.

## 2018-06-29 NOTE — Telephone Encounter (Signed)
Spoke with patient. Advised of message as seen below from [place]. Patient states that she only saw Dr.Arnold during that time as she was having insurance issues. States that she would prefer to return to seeing Dr.Silva at this time and "I find it unfair that she is unable to look at the ultrasound and tell me her opinion when she was my primary doctor." Advised patient since she was receiving care from [place] we want to ensure her plan of care is managed through their office to keep it consist and for her to receive the most fluid care. Patient again states that she would rather have Dr.Silva manage her as she knows her history more in depth. Advised will review with Dr.Silva and return call.

## 2018-06-29 NOTE — Telephone Encounter (Signed)
Spoke with patient. Advised of message as seen below from [place]. Patient verbalizes understanding. Will check her schedule and return call to schedule an appointment. Encounter closed.

## 2018-07-02 ENCOUNTER — Other Ambulatory Visit: Payer: Self-pay | Admitting: Certified Nurse Midwife

## 2018-07-02 ENCOUNTER — Telehealth: Payer: Self-pay | Admitting: *Deleted

## 2018-07-02 DIAGNOSIS — N920 Excessive and frequent menstruation with regular cycle: Secondary | ICD-10-CM

## 2018-07-02 NOTE — Telephone Encounter (Signed)
Pt left VM message stating that she has questions for Dr. Roselie Awkward regarding her fibroid results and whether she needs a follow up appt. Also, pt is requesting refill of birth control. Per chart review, pt has several recent telephone encounters with Dr. Elza Rafter office which state her intention to resume Gyn care in that office. She has next scheduled appointment on 01/28/19 to re-establish care.

## 2018-07-04 MED ORDER — NORGESTIMATE-ETH ESTRADIOL 0.25-35 MG-MCG PO TABS: 1.0000 | ORAL_TABLET | Freq: Every day | ORAL | 0 refills | Status: DC

## 2018-07-04 NOTE — Telephone Encounter (Signed)
Pt reports she her bleeding and pain has much improved since her previous visit. Pt reports she was changed to [place] birth control and is requesting a refill on that medication until she is able to get financial assistance and make another appointment. Order placed.  Will send message to front office staff about the next steps that the pt needs to take to get financial assistance.

## 2018-08-20 ENCOUNTER — Other Ambulatory Visit: Payer: Self-pay | Admitting: Certified Nurse Midwife

## 2018-08-20 ENCOUNTER — Other Ambulatory Visit: Payer: Self-pay

## 2018-08-20 DIAGNOSIS — N920 Excessive and frequent menstruation with regular cycle: Secondary | ICD-10-CM

## 2018-08-21 ENCOUNTER — Encounter: Payer: Self-pay | Admitting: *Deleted

## 2018-08-21 ENCOUNTER — Other Ambulatory Visit: Payer: Self-pay | Admitting: *Deleted

## 2018-08-21 DIAGNOSIS — N920 Excessive and frequent menstruation with regular cycle: Secondary | ICD-10-CM

## 2018-08-21 MED ORDER — IBUPROFEN 800 MG PO TABS: 800.0000 mg | ORAL_TABLET | Freq: Three times a day (TID) | ORAL | 1 refills | Status: DC | PRN

## 2018-08-21 MED ORDER — NORGESTIMATE-ETH ESTRADIOL 0.25-35 MG-MCG PO TABS: 1.0000 | ORAL_TABLET | Freq: Every day | ORAL | 2 refills | Status: DC

## 2018-08-27 MED ORDER — NORGESTIMATE-ETH ESTRADIOL 0.25-35 MG-MCG PO TABS: 1.0000 | ORAL_TABLET | Freq: Every day | ORAL | 2 refills | Status: DC

## 2018-09-03 NOTE — Telephone Encounter (Signed)
traMADol (ULTRAM) 50 MG tablet       Sig: Take 1 tablet (50 mg total) by mouth every 6 (six) hours as needed.   Disp:  20 tablet  Refills:  0   Start: 08/20/2018 - 08/20/2019   Class: Normal   Non-formulary   Last ordered: 4 months ago by Julianne Handler, CNM      To be filled at: [id number] - [place], [place]

## 2018-10-25 ENCOUNTER — Telehealth: Payer: Self-pay

## 2018-11-13 ENCOUNTER — Telehealth: Payer: Medicaid Other

## 2018-11-13 ENCOUNTER — Ambulatory Visit (HOSPITAL_COMMUNITY)
Admission: EM | Admit: 2018-11-13 | Discharge: 2018-11-13 | Disposition: A | Discharge status: Home / Self Care | Payer: Self-pay

## 2018-11-13 ENCOUNTER — Inpatient Hospital Stay (HOSPITAL_COMMUNITY)
Admission: AD | Admit: 2018-11-13 | Discharge: 2018-11-13 | Disposition: A | Discharge status: Home / Self Care | Payer: Medicaid Other | Attending: Family Medicine | Admitting: Family Medicine

## 2018-11-13 ENCOUNTER — Emergency Department (HOSPITAL_COMMUNITY)
Admission: EM | Admit: 2018-11-13 | Discharge: 2018-11-13 | Disposition: A | Discharge status: Home / Self Care | Payer: Medicaid Other | Attending: Emergency Medicine | Admitting: Emergency Medicine

## 2018-11-13 ENCOUNTER — Other Ambulatory Visit: Payer: Self-pay

## 2018-11-13 DIAGNOSIS — N939 Abnormal uterine and vaginal bleeding, unspecified: Secondary | ICD-10-CM | POA: Insufficient documentation

## 2018-11-13 DIAGNOSIS — Z3202 Encounter for pregnancy test, result negative: Secondary | ICD-10-CM | POA: Insufficient documentation

## 2018-11-13 DIAGNOSIS — R1030 Lower abdominal pain, unspecified: Secondary | ICD-10-CM | POA: Insufficient documentation

## 2018-11-13 DIAGNOSIS — R42 Dizziness and giddiness: Secondary | ICD-10-CM | POA: Insufficient documentation

## 2018-11-13 DIAGNOSIS — R102 Pelvic and perineal pain: Secondary | ICD-10-CM | POA: Insufficient documentation

## 2018-11-13 LAB — CBC WITH DIFFERENTIAL/PLATELET
Abs Immature Granulocytes: 0.01 10*3/uL (ref 0.00–0.07)
Basophils Absolute: 0 10*3/uL (ref 0.0–0.1)
Basophils Relative: 1 %
Eosinophils Absolute: 0.1 10*3/uL (ref 0.0–0.5)
Eosinophils Relative: 1 %
HCT: 39.5 % (ref 36.0–46.0)
Hemoglobin: 13.3 g/dL (ref 12.0–15.0)
Immature Granulocytes: 0 %
Lymphocytes Relative: 18 %
Lymphs Abs: 1.4 10*3/uL (ref 0.7–4.0)
MCH: 34 pg (ref 26.0–34.0)
MCHC: 33.7 g/dL (ref 30.0–36.0)
MCV: 101 fL — ABNORMAL HIGH (ref 80.0–100.0)
Monocytes Absolute: 0.5 10*3/uL (ref 0.1–1.0)
Monocytes Relative: 7 %
Neutro Abs: 5.8 10*3/uL (ref 1.7–7.7)
Neutrophils Relative %: 73 %
Platelets: 237 10*3/uL (ref 150–400)
RBC: 3.91 MIL/uL (ref 3.87–5.11)
RDW: 11.6 % (ref 11.5–15.5)
WBC: 7.8 10*3/uL (ref 4.0–10.5)
nRBC: 0 % (ref 0.0–0.2)

## 2018-11-13 LAB — COMPREHENSIVE METABOLIC PANEL
ALT: 20 U/L (ref 0–44)
AST: 26 U/L (ref 15–41)
Albumin: 3.5 g/dL (ref 3.5–5.0)
Alkaline Phosphatase: 46 U/L (ref 38–126)
Anion gap: 8 (ref 5–15)
BUN: 9 mg/dL (ref 6–20)
CO2: 22 mmol/L (ref 22–32)
Calcium: 9 mg/dL (ref 8.9–10.3)
Chloride: 108 mmol/L (ref 98–111)
Creatinine, Ser: 0.73 mg/dL (ref 0.44–1.00)
GFR calc Af Amer: >60 mL/min (ref >60)
GFR calc non Af Amer: >60 mL/min (ref >60)
Glucose, Bld: 115 mg/dL — ABNORMAL HIGH (ref 70–99)
Potassium: 4.1 mmol/L (ref 3.5–5.1)
Sodium: 138 mmol/L (ref 135–145)
Total Bilirubin: 1.8 mg/dL — ABNORMAL HIGH (ref 0.3–1.2)
Total Protein: 6.6 g/dL (ref 6.5–8.1)

## 2018-11-13 LAB — ABO/RH: ABO/RH(D): O POS

## 2018-11-13 LAB — WET PREP, GENITAL
Clue Cells Wet Prep HPF POC: NONE SEEN
Sperm: NONE SEEN
Trich, Wet Prep: NONE SEEN
Yeast Wet Prep HPF POC: NONE SEEN

## 2018-11-13 LAB — TYPE AND SCREEN
ABO/RH(D): O POS
Antibody Screen: NEGATIVE

## 2018-11-13 LAB — POCT PREGNANCY, URINE: Preg Test, Ur: NEGATIVE

## 2018-11-13 MED ORDER — IBUPROFEN 800 MG PO TABS: 800.0000 mg | ORAL_TABLET | Freq: Three times a day (TID) | ORAL | 0 refills | Status: DC

## 2018-11-13 MED ORDER — MEGESTROL ACETATE 40 MG PO TABS: 80.0000 mg | ORAL_TABLET | Freq: Two times a day (BID) | ORAL | 0 refills | Status: AC

## 2018-11-13 MED ORDER — KETOROLAC TROMETHAMINE 30 MG/ML IJ SOLN
30.0000 mg | Freq: Once | INTRAMUSCULAR | Status: AC
  Administered 2018-11-13: 30 mg via INTRAVENOUS
  Filled 2018-11-13: qty 1

## 2018-11-13 MED ORDER — TRAMADOL HCL 50 MG PO TABS: 50.0000 mg | ORAL_TABLET | Freq: Four times a day (QID) | ORAL | 0 refills | Status: DC | PRN

## 2018-11-13 NOTE — Discharge Instructions (Addendum)
Take Megace twice daily as prescribed for 1 month, unless told otherwise by your OB/GYN.  Take ibuprofen every 6 hours as needed for pain.  For severe, breakthrough pain, take tramadol every 6 hours.  Do not drive or operate machinery while taking this medication.  Please follow-up with Dr. Roselie Awkward as planned.  Please return the emergency department if you develop any new or worsening symptoms.  Do not drink alcohol, drive, operate machinery or participate in any other potentially dangerous activities while taking opiate pain medication as it may make you sleepy. Do not take this medication with any other sedating medications, either prescription or over-the-counter. If you were prescribed Percocet or Vicodin, do not take these with acetaminophen (Tylenol) as it is already contained within these medications and overdose of Tylenol is dangerous.   This medication is an opiate (or narcotic) pain medication and can be habit forming.  Use it as little as possible to achieve adequate pain control.  Do not use or use it with extreme caution if you have a history of opiate abuse or dependence. This medication is intended for your use only - do not give any to anyone else and keep it in a secure place where nobody else, especially children, have access to it. It will also cause or worsen constipation, so you may want to consider taking an over-the-counter stool softener while you are taking this medication.

## 2018-11-13 NOTE — ED Provider Notes (Signed)
[facility] EMERGENCY DEPARTMENT Provider Note   CSN: [id number] Arrival date & time: 11/13/18  1551     History   Chief Complaint Chief Complaint  Patient presents with  . Vaginal Bleeding    HPI Valerie Peters is a 28 y.o. female with history of abnormal uterine bleeding with known fibroids who presents with ongoing vaginal bleeding and lower abdominal cramping that has been ongoing for the past month.  Patient had previously been treated with Sprintec for the bleeding, however it has stopped working.  She has seen Dr[facility] Rosel[facility] and has been trying to get back into seeing Dr. Quincy Simmonds, as she is seen her in the past.  She describes bleeding as heavy, feeling 2 pads per hour.  She has had some small clots.  She has been having some lightheadedness, but no syncopal episodes.  She denies any chest pain or shortness of breath.  She has never had to receive a blood transfusion.     HPI  Past Medical History:  Diagnosis Date  . Abnormal uterine bleeding   . Anxiety   . Depression   . Dysmenorrhea   . Heart murmur   . HSV-1 (herpes simplex virus 1) infection     Patient Active Problem List   Diagnosis Date Noted  . Fibroids, submucosal 05/03/2018  . Menorrhagia with regular cycle 05/03/2018    Past Surgical History:  Procedure Laterality Date  . NO PAST SURGERIES       OB History    Gravida  0   Para  0   Term  0   Preterm  0   AB  0   Living  0     SAB  0   TAB  0   Ectopic  0   Multiple  0   Live Births  0            Home Medications    Prior to Admission medications   Medication Sig Start Date End Date Taking? Authorizing Provider  ibuprofen (ADVIL) 800 MG tablet Take 1 tablet (800 mg total) by mouth 3 (three) times daily. 11/13/18   Keilah Lemire, Bea Graff, PA-C  megestrol (MEGACE) 40 MG tablet Take 2 tablets (80 mg total) by mouth 2 (two) times daily. 11/13/18 12/13/18  Frederica Kuster, PA-C  traMADol  (ULTRAM) 50 MG tablet Take 1 tablet (50 mg total) by mouth every 6 (six) hours as needed for severe pain. 11/13/18   Avry Monteleone, Bea Graff, PA-C  norgestimate-ethinyl estradiol (ORTHO-CYCLEN) 0.25-35 MG-MCG tablet Take 1 tablet by mouth daily. 08/27/18 11/13/18  Woodroe Mode, MD    Family History Family History  Problem Relation Age of Onset  . Hypertension Father     Social History Social History   Tobacco Use  . Smoking status: Never Smoker  . Smokeless tobacco: Never Used  Substance Use Topics  . Alcohol use: Yes    Comment: occ glass of wine  . Drug use: Never     Allergies   Lactose intolerance (gi)   Review of Systems Review of Systems  Constitutional: Negative for chills and fever.  HENT: Negative for facial swelling and sore throat.   Respiratory: Negative for shortness of breath.   Cardiovascular: Negative for chest pain.  Gastrointestinal: Positive for abdominal pain (pelvic). Negative for nausea and vomiting.  Genitourinary: Positive for pelvic pain and vaginal bleeding. Negative for dysuria and flank pain.  Musculoskeletal: Negative for back pain.  Skin: Negative for rash and wound.  Neurological: Positive for light-headedness. Negative for syncope and headaches.  Psychiatric/Behavioral: The patient is not nervous/anxious.      Physical Exam Updated Vital Signs BP 125/86 (BP Location: Right Arm)   Pulse 83   Temp 98.8 F (37.1 C) (Oral)   Resp 18   Wt 130.6 kg   LMP 11/13/2018   SpO2 100%   BMI 44.44 kg/m   Physical Exam Vitals signs and nursing note reviewed. Exam conducted with a chaperone present.  Constitutional:      General: She is not in acute distress.    Appearance: She is well-developed. She is not diaphoretic.  HENT:     Head: Normocephalic and atraumatic.     Mouth/Throat:     Pharynx: No oropharyngeal exudate.  Eyes:     General: No scleral icterus.       Right eye: No discharge.        Left eye: No discharge.      Conjunctiva/sclera: Conjunctivae normal.     Pupils: Pupils are equal, round, and reactive to light.  Neck:     Musculoskeletal: Normal range of motion and neck supple.     Thyroid: No thyromegaly.  Cardiovascular:     Rate and Rhythm: Normal rate and regular rhythm.     Heart sounds: Normal heart sounds. No murmur. No friction rub. No gallop.   Pulmonary:     Effort: Pulmonary effort is normal. No respiratory distress.     Breath sounds: Normal breath sounds. No stridor. No wheezing or rales.  Abdominal:     General: Bowel sounds are normal. There is no distension.     Palpations: Abdomen is soft.     Tenderness: There is abdominal tenderness in the suprapubic area. There is no right CVA tenderness, left CVA tenderness, guarding or rebound.  Genitourinary:    Vagina: Bleeding present.     Cervix: Cervical bleeding present. No cervical motion tenderness.     Uterus: Normal.      Adnexa:        Right: No tenderness.         Left: No tenderness.       Comments: Mild to moderate bleeding from the cervical os Lymphadenopathy:     Cervical: No cervical adenopathy.  Skin:    General: Skin is warm and dry.     Coloration: Skin is not pale.     Findings: No rash.  Neurological:     Mental Status: She is alert.     Coordination: Coordination normal.      ED Treatments / Results  Labs (all labs ordered are listed, but only abnormal results are displayed) Labs Reviewed  WET PREP, GENITAL - Abnormal; Notable for the following components:      Result Value   WBC, Wet Prep HPF POC MODERATE (*)    All other components within normal limits  COMPREHENSIVE METABOLIC PANEL - Abnormal; Notable for the following components:   Glucose, Bld 115 (*)    Total Bilirubin 1.8 (*)    All other components within normal limits  CBC WITH DIFFERENTIAL/PLATELET - Abnormal; Notable for the following components:   MCV 101.0 (*)    All other components within normal limits  TYPE AND SCREEN  ABO/RH   GC/CHLAMYDIA PROBE AMP ([place]) NOT AT [employer]    EKG None  Radiology No results found.  Procedures Procedures (including critical care time)  Medications Ordered in ED Medications  ketorolac (TORADOL)  30 MG/ML injection 30 mg (30 mg Intravenous Given 11/13/18 1753)     Initial Impression / Assessment and Plan / ED Course  I have reviewed the triage vital signs and the nursing notes.  Pertinent labs & imaging results that were available during my care of the patient were reviewed by me and considered in my medical decision making (see chart for details).        Patient presents with presents with ongoing vaginal bleeding.  She has a diagnosis of uterine fibroid.  She has been taking Sprintec, however it has stopped working.  Her hemoglobin is holding stable at 13.3.  I discussed patient case with Dr. Nehemiah Settle who is on-call for OB/GYN who recommends Megace 80 mg twice daily for the next month.  We will start home with ibuprofen and tramadol.  No indication to repeat imaging today.  Patient has already reached out to the office to schedule an appointment. Return precautions discussed. Patient understands and agrees with plan.  Patient vitals stable throughout ED course and discharged in satisfactory condition.  Final Clinical Impressions(s) / ED Diagnoses   Final diagnoses:  Abnormal uterine bleeding    ED Discharge Orders         Ordered    megestrol (MEGACE) 40 MG tablet  2 times daily     11/13/18 1800    ibuprofen (ADVIL) 800 MG tablet  3 times daily     11/13/18 1800    traMADol (ULTRAM) 50 MG tablet  Every 6 hours PRN     11/13/18 1800           Marycatherine Maniscalco, Bea Graff, PA-C 11/14/18 2203    Carmin Muskrat, MD 11/16/18 [phone]

## 2018-11-13 NOTE — ED Triage Notes (Signed)
Pt reports vaginal bleeding x 30 days. She reports hx of fibroids. Recently finished pack of birth control, reports bleeding has worsened. She reports she is having severe menstrual cramps. She reports on 8/30 she had a migraine with 1 episode of vomiting.

## 2018-11-13 NOTE — MAU Provider Note (Addendum)
S Valerie Peters is a 28 y.o. G0P0000 female who presents to MAU today with complaint of vaginal bleeding and abd cramping for 1 month. She does not think she is pregnant.   O BP 125/81   Pulse 86   Temp 98.4 F (36.9 C)   Resp 16   LMP 11/13/2018  Physical Exam  Nursing note and vitals reviewed. Constitutional: She is oriented to person, place, and time. She appears well-developed and well-nourished. No distress.  HENT:  Head: Normocephalic.  Neck: Normal range of motion.  Cardiovascular: Normal rate.  Respiratory: Effort normal. No respiratory distress.  Musculoskeletal: Normal range of motion.  Neurological: She is alert and oriented to person, place, and time.  Psychiatric: She has a normal mood and affect.   Results for orders placed or performed during the hospital encounter of 11/13/18 (from the past 24 hour(s))  Pregnancy, urine POC     Status: None   Collection Time: 11/13/18  3:05 PM  Result Value Ref Range   Preg Test, Ur NEGATIVE NEGATIVE   A Non pregnant female Medical screening exam complete  P Discharge from MAU in stable condition Patient given the option of transfer to [facility] for further evaluation or seek care in outpatient facility of choice- pt prefers [facility] She will also need to make appt for f/u in [facility] List of options for follow-up given  Warning signs for worsening condition that would warrant emergency follow-up discussed Patient may return to MAU as needed for pregnancy related complaints  Julianne Handler, CNM 11/13/2018 3:17 PM

## 2018-11-13 NOTE — MAU Note (Signed)
.   Valerie Peters is a 28 y.o. at Unknown here in MAU reporting: vaginal bleeding for a month with lower abdominal cramping LMP: 11/13/18 Onset of complaint: a month Pain score:  Vitals:   11/13/18 1509  BP: (!) 133/97  Pulse: 86  Resp: 16      Lab orders placed from triage: UPT

## 2018-11-15 LAB — GC/CHLAMYDIA PROBE AMP (~~LOC~~) NOT AT ARMC
Chlamydia: NEGATIVE
Neisseria Gonorrhea: NEGATIVE

## 2018-12-11 ENCOUNTER — Telehealth: Payer: Self-pay | Admitting: Obstetrics and Gynecology

## 2018-12-11 NOTE — Telephone Encounter (Signed)
Spoke with patient. Patient states she thought the message was going to another provider. Patient was seen in ER on 11/13/18 for AUB. Per review of Epic, patient has an appt on 12/31/18 at the [facility] for f/u. Patient desire to continue her care at [facility] for now, advised patient she is scheduled for an AEX with Dr. Quincy Simmonds on 01/31/19, patient does not want to cancel this appt at this time. Questions answered, patient to f/u with [facility].   Routing to provider for final review. Patient is agreeable to disposition. Will close encounter.

## 2018-12-11 NOTE — Telephone Encounter (Signed)
Patient sent the following message through [facility]. Routing to triage to assist patient with scheduling.  Appointment Request From: Valerie Peters  With Provider: Arloa Koh, MD [facility]]  Preferred Date Range: 12/12/2018 - 12/12/2018  Preferred Times: Any Time  Reason for visit: Office Visit  Comments: Fibroids

## 2018-12-31 ENCOUNTER — Ambulatory Visit (INDEPENDENT_AMBULATORY_CARE_PROVIDER_SITE_OTHER): Payer: Self-pay | Admitting: Obstetrics & Gynecology

## 2018-12-31 ENCOUNTER — Telehealth: Payer: Self-pay | Admitting: Obstetrics & Gynecology

## 2018-12-31 ENCOUNTER — Other Ambulatory Visit: Payer: Self-pay

## 2018-12-31 ENCOUNTER — Encounter: Payer: Self-pay | Admitting: Obstetrics & Gynecology

## 2018-12-31 VITALS — BP 102/71 | HR 78 | Wt 143.6 lb

## 2018-12-31 DIAGNOSIS — N920 Excessive and frequent menstruation with regular cycle: Secondary | ICD-10-CM

## 2018-12-31 DIAGNOSIS — N946 Dysmenorrhea, unspecified: Secondary | ICD-10-CM

## 2018-12-31 DIAGNOSIS — D219 Benign neoplasm of connective and other soft tissue, unspecified: Secondary | ICD-10-CM

## 2018-12-31 MED ORDER — NAPROXEN 500 MG PO TABS: 500.0000 mg | ORAL_TABLET | Freq: Two times a day (BID) | ORAL | 2 refills | Status: DC

## 2018-12-31 MED ORDER — XULANE 150-35 MCG/24HR TD PTWK: 1.0000 | MEDICATED_PATCH | TRANSDERMAL | 12 refills | Status: DC

## 2018-12-31 NOTE — Telephone Encounter (Signed)
Patient return office calls about her appointment on 10/19 @ 1:15. Patient stated that she does not go to [facility] anymore due to insurance issues and she just has not canceled the appointment yet. She does not want her appointment cancel she has been waiting forever for this appointment and she feels it it not fair that we are calling the same day to cancel the appointment. She will be coming to the appointment today. Patient stated she has if she feels like she still needs to see [facility] then she will. Patient instructed that she cannot have two GYN offices and she will have to make a decision on which one she is wanting to get her care done at.

## 2018-12-31 NOTE — Patient Instructions (Signed)
Oral contraceptive pills vs patch  Progesterone only pills vs Depo Provera  Lysteda (nonhormonal) + Naproxen  Oriahnn (new medication)  Surgical management   Myomectomy  Myomectomy is a surgery in which a non-cancerous fibroid (myoma) is removed from the uterus. Myomas are tumors made up of fibrous tissue. They are often called fibroid tumors. Fibroid tumors can range from the size of a pea to the size of a grapefruit. In a myomectomy, the fibroid tumor is removed without removing the uterus. Because these tumors are rarely cancerous, this surgery is usually done only if the tumor is growing or causing symptoms such as pain, pressure, bleeding, or pain with intercourse. Tell a health care provider about:  Any allergies you have.  All medicines you are taking, including vitamins, herbs, eye drops, creams, and over-the-counter medicines.  Any problems you or family members have had with anesthetic medicines.  Any blood disorders you have.  Any surgeries you have had.  Any medical conditions you have. What are the risks? Generally, this is a safe procedure. However, problems may occur, including:  Bleeding.  Infection.  Allergic reactions to medicines.  Damage to other structures or organs.  Blood clots in the legs, chest, or brain.  Scar tissue on other organs and in the pelvis. This may require another surgery to remove the scar tissue. What happens before the procedure? Staying hydrated Follow instructions from your health care provider about hydration, which may include:  Up to 2 hours before the procedure - you may continue to drink clear liquids, such as water, clear fruit juice, black coffee, and plain tea. Eating and drinking restrictions Follow instructions from your health care provider about eating and drinking, which may include:  8 hours before the procedure - stop eating heavy meals or foods such as meat, fried foods, or fatty foods.  6 hours before the  procedure - stop eating light meals or foods, such as toast or cereal.  6 hours before the procedure - stop drinking milk or drinks that contain milk.  2 hours before the procedure - stop drinking clear liquids. General instructions  Ask your health care provider about: ? Changing or stopping your regular medicines. This is especially important if you are taking diabetes medicines or blood thinners. ? Taking medicines such as aspirin and ibuprofen. These medicines can thin your blood. Do not take these medicines before your procedure if your health care provider instructs you not to.  Do not drink alcohol the day before the surgery.  Do not use any products that contain nicotine or tobacco, such as cigarettes and e-cigarettes, for 2 weeks before the procedure. If you need help quitting, ask your health care provider.  Plan to have someone take you home from the hospital or clinic. Also arrange for someone to help you with activities during your recovery. What happens during the procedure?  To reduce your risk of infection: ? Your health care team will wash or sanitize their hands. ? Your skin will be washed with soap. ? Hair may be removed from the surgical area.  An IV tube will be inserted into one of your veins. Medicines will be able to flow directly into your body through this IV tube.  You will be given one or more of the following: ? A medicine to help you relax (sedative). ? A medicine to make you fall asleep (general anesthetic).  Small monitors will be attached to your body. They will be used to check your heart, blood  pressure, and oxygen level.  A breathing tube will be placed into your lungs during the procedure.  A thin, flexible tube (catheter) will be inserted into your bladder to collect urine.  Your surgeon will use one of the following methods to perform the procedure. The method used will depend on the size, shape, location, and number of  fibroids. Hysteroscopic myomectomy This method may be used when the fibroid tumor is inside the cavity of the uterus. A long, thin tube with a lens (hysteroscope) will be inserted into the uterus through the vagina. A saline solution will be put into the uterus. This will expand the uterus and allow the surgeon to see the fibroids. Tools will be passed through the hysteroscope to remove the fibroid tumor in pieces. Laparoscopic myomectomy A few small incisions will be made in the lower abdomen. A thin, lighted tube with a camera (laparoscope) will be inserted through one of the incisions. This will give the surgeon a good view of the area. The fibroid tumor will be removed through the other incisions. The incisions will then be closed with stitches (sutures) or staples. Abdominal myomectomy This method is used when the fibroid tumor cannot be removed with a hysteroscope or laparoscope. The surgery will be done through a larger surgical incision in the abdomen. The fibroid tumor will be removed through this incision. The incision will be closed with sutures or staples. Recovery time will be longer if this method is used. The procedures may vary among health care providers and hospitals. What happens after the procedure?  Your blood pressure, heart rate, breathing rate, and blood oxygen level will be monitored until the medicines you were given have worn off.  The IV access tube and catheter will remain on your body for a period of time.  You may be given medicine for pain or to help you sleep.  You may be given an antibiotic medicine if needed.  Do not drive for 24 hours if you were given a sedative. Summary  Myomectomy is surgery to remove a noncancerous fibroid (myoma) from the uterus.  This surgery is usually done only if the tumor is growing or causing symptoms such as pain, pressure, bleeding, or pain during intercourse.  Follow instructions from your health care provider about eating  and drinking before the procedure.  Recovery time from this procedure depends on the method. The abdominal method will require a longer recovery time. This information is not intended to replace advice given to you by your health care provider. Make sure you discuss any questions you have with your health care provider. Document Released: 12/26/2006 Document Revised: 06/22/2018 Document Reviewed: 03/31/2016 Elsevier Patient Education  2020 [employer].    Hysteroscopy Hysteroscopy is a procedure that is used to examine the inside of a woman's womb (uterus). This may be done for various reasons, including:  To look for lumps (tumors) and other growths in the uterus.  To evaluate abnormal bleeding, fibroid tumors, polyps, scar tissue (adhesions), or cancer of the uterus.  To determine the cause of an inability to get pregnant (infertility) or repeated losses of pregnancies (miscarriages).  To find a lost IUD (intrauterine device).  To perform a procedure that permanently prevents pregnancy (sterilization). During this procedure, a thin, flexible tube with a small light and camera (hysteroscope) is used to examine the uterus. The camera sends images to a monitor in the room so that your health care provider can view the inside of your uterus. A hysteroscopy should  be done right after a menstrual period to make sure that you are not pregnant. Tell a health care provider about:  Any allergies you have.  All medicines you are taking, including vitamins, herbs, eye drops, creams, and over-the-counter medicines.  Any problems you or family members have had with the use of anesthetic medicines.  Any blood disorders you have.  Any surgeries you have had.  Any medical conditions you have.  Whether you are pregnant or may be pregnant. What are the risks? Generally, this is a safe procedure. However, problems may occur, including:  Excessive bleeding.  Infection.  Damage to the  uterus or other structures or organs.  Allergic reaction to medicines or fluids that are used in the procedure. What happens before the procedure? Staying hydrated Follow instructions from your health care provider about hydration, which may include:  Up to 2 hours before the procedure - you may continue to drink clear liquids, such as water, clear fruit juice, black coffee, and plain tea. Eating and drinking restrictions Follow instructions from your health care provider about eating and drinking, which may include:  8 hours before the procedure - stop eating solid foods and drink clear liquids only  2 hours before the procedure - stop drinking clear liquids. General instructions  Ask your health care provider about: ? Changing or stopping your normal medicines. This is important if you take diabetes medicines or blood thinners. ? Taking medicines such as aspirin and ibuprofen. These medicines can thin your blood and cause bleeding. Do not take these medicines for 1 week before your procedure, or as told by your health care provider.  Do not use any products that contain nicotine or tobacco for 2 weeks before the procedure. This includes cigarettes and e-cigarettes. If you need help quitting, ask your health care provider.  Medicine may be placed in your cervix the day before the procedure. This medicine causes the cervix to have a larger opening (dilate). The larger opening makes it easier for the hysteroscope to be inserted into the uterus during the procedure.  Plan to have someone with you for the first 24-48 hours after the procedure, especially if you are given a medicine to make you fall asleep (general anesthetic).  Plan to have someone take you home from the hospital or clinic. What happens during the procedure?  To lower your risk of infection: ? Your health care team will wash or sanitize their hands. ? Your skin will be washed with soap. ? Hair may be removed from the  surgical area.  An IV tube will be inserted into one of your veins.  You may be given one or more of the following: ? A medicine to help you relax (sedative). ? A medicine that numbs the area around the cervix (local anesthetic). ? A medicine to make you fall asleep (general anesthetic).  A hysteroscope will be inserted through your vagina and into your uterus.  Air or fluid will be used to enlarge your uterus, enabling your health care provider to see your uterus better. The amount of fluid used will be carefully checked throughout the procedure.  In some cases, tissue may be gently scraped from inside the uterus and sent to a lab for testing (biopsy). The procedure may vary among health care providers and hospitals. What happens after the procedure?  Your blood pressure, heart rate, breathing rate, and blood oxygen level will be monitored until the medicines you were given have worn off.  You may  have some cramping. You may be given medicines for this.  You may have bleeding, which varies from light spotting to menstrual-like bleeding. This is normal.  If you had a biopsy done, it is your responsibility to get the results of your procedure. Ask your health care provider, or the department performing the procedure, when your results will be ready. Summary  Hysteroscopy is a procedure that is used to examine the inside of a woman's womb (uterus).  After the procedure, you may have bleeding, which varies from light spotting to menstrual-like bleeding. This is normal. You may also have cramping.  Plan to have someone take you home from the hospital or clinic. This information is not intended to replace advice given to you by your health care provider. Make sure you discuss any questions you have with your health care provider. Document Released: 06/06/2000 Document Revised: 02/10/2017 Document Reviewed: 03/29/2016 Elsevier Patient Education  2020 [employer].

## 2018-12-31 NOTE — Progress Notes (Signed)
GYNECOLOGY OFFICE VISIT NOTE  History:   Valerie Peters is a 28 y.o. G0 here today for follow up for management of dysmenorrhea in the setting of fibroids and menorrhagia.  Was prescribed OCPs, she was not consistent with them and also did not feel that it was right "not to have a period". Still has pain every month, has to miss work during her periods.  Wants to discuss further management. She denies any current abnormal vaginal discharge, bleeding, pelvic pain or other concerns.    Past Medical History:  Diagnosis Date  . Abnormal uterine bleeding   . Anxiety   . Depression   . Dysmenorrhea   . Heart murmur   . HSV-1 (herpes simplex virus 1) infection     Past Surgical History:  Procedure Laterality Date  . NO PAST SURGERIES      The following portions of the patient's history were reviewed and updated as appropriate: allergies, current medications, past family history, past medical history, past social history, past surgical history and problem list.   Health Maintenance:  Normal pap on 02/07/2018.   Review of Systems:  Pertinent items noted in HPI and remainder of comprehensive ROS otherwise negative.  Physical Exam:  BP 102/71   Pulse 78   Wt 143 lb 9.6 oz (65.1 kg)   LMP 12/11/2018 (Exact Date)   BMI 22.16 kg/m  CONSTITUTIONAL: Well-developed, well-nourished female in no acute distress.  HEENT:  Normocephalic, atraumatic. External right and left ear normal. No scleral icterus.  NECK: Normal range of motion, supple, no masses noted on observation SKIN: No rash noted. Not diaphoretic. No erythema. No pallor. MUSCULOSKELETAL: Normal range of motion. No edema noted. NEUROLOGIC: Alert and oriented to person, place, and time. Normal muscle tone coordination. No cranial nerve deficit noted. PSYCHIATRIC: Normal mood and affect. Normal behavior. Normal judgment and thought content. CARDIOVASCULAR: Normal heart rate noted RESPIRATORY: Effort and breath sounds normal, no  problems with respiration noted ABDOMEN: No masses noted. No other overt distention noted.   PELVIC: Deferred  Labs and Imaging 04/09/2018 TRANSABDOMINAL AND TRANSVAGINAL ULTRASOUND OF PELVIS CLINICAL DATA:  Dysmenorrhea, debilitating pain with menses monthly,heavy menses since her late teens; LMP 03/25/2018 TECHNIQUE: Both transabdominal and transvaginal ultrasound examinations of the pelvis were performed. Transabdominal technique was performed for global imaging of the pelvis including uterus, ovaries, adnexal regions, and pelvic cul-de-sac. It was necessary to proceed with endovaginal exam following the transabdominal exam to visualize the endometrial complex.  COMPARISON:  None  FINDINGS: Uterus  Measurements: 7.4 x 3.0 x 4.9 cm = volume: 108.8 mL. Retroverted.Anterior mid uterine leiomyoma 2.3 x 1.6 x 2.3 cm which extends submucosal. Several additional tiny intramural leiomyomata are noted, less than 1 cm in diameter.  Endometrium  Thickness: 6 mm.  No endometrial fluid or focal abnormality  Right ovary  Measurements: 2.3 x 1.2 x 1.5 cm = volume: 4.1 mL. Small nonspecific calcification within RIGHT ovary 4 mm diameter. Normal morphology without mass  Left ovary  Measurements: 3.6 x 1.2 x 2.7 cm = volume: 11.7 mL. Small collapsed corpus luteum. Otherwise normal morphology without mass  Other findings  Small amount of nonspecific free pelvic fluid. No other adnexal masses.  IMPRESSION: Submucosal leiomyoma within the anterior mid uterus 2.3 cm in greatest size.  Remainder of exam shows no significant abnormalities.   Electronically Signed   By: Lavonia Dana M.D.   On: 04/09/2018 15:07     Assessment and Plan:      1. Fibroids  2. Menorrhagia with regular cycle 3. Dysmenorrhea Discussed management of her fibroids and symptoms.  Different options discussed in detail *OCPs vs patch (more compliance), warned about Black Box warning with patch  *Progestin only therapy (pills, Depo Provera, IUD) *Barbette Merino [facility] agonist+hormones - new medication that can help fibroids) *Lysteda + Naproxen (for menorrhagia and pain) *Surgical intervention All questions answered.  She wants to try the patch for now.  Naproxen also given.  Will continue to monitor symptoms. - naproxen (NAPROSYN) 500 MG tablet; Take 1 tablet (500 mg total) by mouth 2 (two) times daily with a meal. As needed for pain  Dispense: 60 tablet; Refill: 2 - norelgestromin-ethinyl estradiol Marilu Favre) 150-35 MCG/24HR transdermal patch; Place 1 patch onto the skin once a week. Can use in a continuous fashion  Dispense: 3 patch; Refill: 12  Routine preventative health maintenance measures emphasized. Please refer to After Visit Summary for other counseling recommendations.   Return in about 2 months (around 03/02/2019) for Followup.    Total face-to-face time with patient: 25 minutes.  Over 50% of encounter was spent on counseling and coordination of care.   Verita Schneiders, MD, [facility] [employer], [place]

## 2019-01-18 ENCOUNTER — Other Ambulatory Visit: Payer: Self-pay | Admitting: Lactation Services

## 2019-01-18 MED ORDER — NORGESTIMATE-ETH ESTRADIOL 0.25-35 MG-MCG PO TABS: 1.0000 | ORAL_TABLET | Freq: Every day | ORAL | 11 refills | Status: DC

## 2019-01-24 NOTE — Progress Notes (Deleted)
28 y.o. G0P0000 Single African American female here for annual exam.    PCP:     No LMP recorded.           Sexually active: {yes no:314532}  The current method of family planning is {contraception:315051}.    Exercising: {yes no:314532}  {types:19826} Smoker:  no  Health Maintenance: Pap: 02-07-18 Neg, 2017/2018 normal per patient History of abnormal Pap:  Yes, Hx of colposcopy age 16--no treatment. MMG:  n/a Colonoscopy:  n/a BMD:   n/a  Result  n/a TDaP:  *** Gardasil:   yes HIV:01-19-18 NR Hep C:01-19-18 Neg Screening Labs:  Hb today: ***, Urine today: ***   reports that she has never smoked. She has never used smokeless tobacco. She reports current alcohol use. She reports that she does not use drugs.  Past Medical History:  Diagnosis Date  . Abnormal uterine bleeding   . Anxiety   . Depression   . Dysmenorrhea   . Heart murmur   . HSV-1 (herpes simplex virus 1) infection     Past Surgical History:  Procedure Laterality Date  . NO PAST SURGERIES      Current Outpatient Medications  Medication Sig Dispense Refill  . ibuprofen (ADVIL) 800 MG tablet Take 1 tablet (800 mg total) by mouth 3 (three) times daily. 21 tablet 0  . naproxen (NAPROSYN) 500 MG tablet Take 1 tablet (500 mg total) by mouth 2 (two) times daily with a meal. As needed for pain 60 tablet 2  . norelgestromin-ethinyl estradiol Marilu Favre) 150-35 MCG/24HR transdermal patch Place 1 patch onto the skin once a week. Can use in a continuous fashion 3 patch 12  . norgestimate-ethinyl estradiol (ORTHO-CYCLEN) 0.25-35 MG-MCG tablet Take 1 tablet by mouth daily. 1 Package 11  . traMADol (ULTRAM) 50 MG tablet Take 1 tablet (50 mg total) by mouth every 6 (six) hours as needed for severe pain. 8 tablet 0   No current facility-administered medications for this visit.     Family History  Problem Relation Age of Onset  . Hypertension Father     Review of Systems  Exam:   There were no vitals taken for this  visit.    General appearance: alert, cooperative and appears stated age Head: normocephalic, without obvious abnormality, atraumatic Neck: no adenopathy, supple, symmetrical, trachea midline and thyroid normal to inspection and palpation Lungs: clear to auscultation bilaterally Breasts: normal appearance, no masses or tenderness, No nipple retraction or dimpling, No nipple discharge or bleeding, No axillary adenopathy Heart: regular rate and rhythm Abdomen: soft, non-tender; no masses, no organomegaly Extremities: extremities normal, atraumatic, no cyanosis or edema Skin: skin color, texture, turgor normal. No rashes or lesions Lymph nodes: cervical, supraclavicular, and axillary nodes normal. Neurologic: grossly normal  Pelvic: External genitalia:  no lesions              No abnormal inguinal nodes palpated.              Urethra:  normal appearing urethra with no masses, tenderness or lesions              Bartholins and Skenes: normal                 Vagina: normal appearing vagina with normal color and discharge, no lesions              Cervix: no lesions              Pap taken: {yes no:314532} Bimanual Exam:  Uterus:  normal size, contour, position, consistency, mobility, non-tender              Adnexa: no mass, fullness, tenderness              Rectal exam: {yes no:314532}.  Confirms.              Anus:  normal sphincter tone, no lesions  Chaperone was present for exam.  Assessment:   Well woman visit with normal exam.   Plan: Mammogram screening discussed. Self breast awareness reviewed. Pap and HR HPV as above. Guidelines for Calcium, Vitamin D, regular exercise program including cardiovascular and weight bearing exercise.   Follow up annually and prn.   Additional counseling given.  {yes [id number]. _______ minutes face to face time of which over 50% was spent in counseling.    After visit summary provided.

## 2019-01-28 ENCOUNTER — Ambulatory Visit: Payer: Medicaid Other | Admitting: Obstetrics and Gynecology

## 2019-01-29 ENCOUNTER — Telehealth (INDEPENDENT_AMBULATORY_CARE_PROVIDER_SITE_OTHER): Payer: Self-pay | Admitting: General Practice

## 2019-01-29 DIAGNOSIS — N920 Excessive and frequent menstruation with regular cycle: Secondary | ICD-10-CM

## 2019-01-29 MED ORDER — PIRMELLA 1/35 1-35 MG-MCG PO TABS: 1.0000 | ORAL_TABLET | Freq: Every day | ORAL | 11 refills | Status: DC

## 2019-01-29 NOTE — Telephone Encounter (Signed)
Called patient stating I was trying to reach her regarding her mychart medication question. Discussed Dr Harolyn Rutherford felt the Sprintec was fine for her but would also prescribe something else with a different amount of hormones if that was her preference. Patient states she had bleeding for a month on the Sprintec and just doesn't want that to happen again. Discussed some irregular bleeding can happen with new starts of birth control but hopefully that wouldn't happen with this one. Patient verbalized understanding & asked about side effects for the one with more hormones compared to less. Told patient I couldn't answer that question about it is higher in one of the hormones & the same dose in another hormone. Told her with a higher dose she hopefully won't have bleeding issues. Patient verbalized understanding & had no questions.

## 2019-02-04 ENCOUNTER — Emergency Department (HOSPITAL_COMMUNITY)
Admission: EM | Admit: 2019-02-04 | Discharge: 2019-02-04 | Disposition: A | Discharge status: Home / Self Care | Payer: Medicaid Other | Attending: Emergency Medicine | Admitting: Emergency Medicine

## 2019-02-04 ENCOUNTER — Encounter (HOSPITAL_COMMUNITY): Payer: Self-pay | Admitting: Emergency Medicine

## 2019-02-04 ENCOUNTER — Other Ambulatory Visit: Payer: Self-pay

## 2019-02-04 DIAGNOSIS — N946 Dysmenorrhea, unspecified: Secondary | ICD-10-CM | POA: Insufficient documentation

## 2019-02-04 LAB — I-STAT BETA HCG BLOOD, ED (MC, WL, AP ONLY): I-stat hCG, quantitative: <5 m[IU]/mL (ref <5)

## 2019-02-04 LAB — COMPREHENSIVE METABOLIC PANEL
ALT: 34 U/L (ref 0–44)
AST: 28 U/L (ref 15–41)
Albumin: 4.6 g/dL (ref 3.5–5.0)
Alkaline Phosphatase: 63 U/L (ref 38–126)
Anion gap: 8 (ref 5–15)
BUN: 5 mg/dL — ABNORMAL LOW (ref 6–20)
CO2: 23 mmol/L (ref 22–32)
Calcium: 9.6 mg/dL (ref 8.9–10.3)
Chloride: 107 mmol/L (ref 98–111)
Creatinine, Ser: 0.81 mg/dL (ref 0.44–1.00)
GFR calc Af Amer: >60 mL/min (ref >60)
GFR calc non Af Amer: >60 mL/min (ref >60)
Glucose, Bld: 116 mg/dL — ABNORMAL HIGH (ref 70–99)
Potassium: 3.5 mmol/L (ref 3.5–5.1)
Sodium: 138 mmol/L (ref 135–145)
Total Bilirubin: 2.1 mg/dL — ABNORMAL HIGH (ref 0.3–1.2)
Total Protein: 7.8 g/dL (ref 6.5–8.1)

## 2019-02-04 LAB — CBC
HCT: 41.8 % (ref 36.0–46.0)
Hemoglobin: 14.3 g/dL (ref 12.0–15.0)
MCH: 34 pg (ref 26.0–34.0)
MCHC: 34.2 g/dL (ref 30.0–36.0)
MCV: 99.3 fL (ref 80.0–100.0)
Platelets: 233 10*3/uL (ref 150–400)
RBC: 4.21 MIL/uL (ref 3.87–5.11)
RDW: 11.4 % — ABNORMAL LOW (ref 11.5–15.5)
WBC: 12.2 10*3/uL — ABNORMAL HIGH (ref 4.0–10.5)
nRBC: 0 % (ref 0.0–0.2)

## 2019-02-04 LAB — URINALYSIS, ROUTINE W REFLEX MICROSCOPIC
Bacteria, UA: NONE SEEN
Bilirubin Urine: NEGATIVE
Glucose, UA: NEGATIVE mg/dL
Ketones, ur: 80 mg/dL — AB
Leukocytes,Ua: NEGATIVE
Nitrite: NEGATIVE
Protein, ur: 30 mg/dL — AB
RBC / HPF: >50 RBC/hpf — ABNORMAL HIGH (ref 0–5)
Specific Gravity, Urine: 1.015 (ref 1.005–1.030)
pH: 8 (ref 5.0–8.0)

## 2019-02-04 MED ORDER — ONDANSETRON 4 MG PO TBDP: 4.0000 mg | ORAL_TABLET | Freq: Three times a day (TID) | ORAL | 0 refills | Status: DC | PRN

## 2019-02-04 MED ORDER — KETOROLAC TROMETHAMINE 15 MG/ML IJ SOLN
15.0000 mg | Freq: Once | INTRAMUSCULAR | Status: AC
  Administered 2019-02-04: 15 mg via INTRAVENOUS
  Filled 2019-02-04: qty 1

## 2019-02-04 MED ORDER — TRAMADOL HCL 50 MG PO TABS: 50.0000 mg | ORAL_TABLET | Freq: Four times a day (QID) | ORAL | 0 refills | Status: DC | PRN

## 2019-02-04 MED ORDER — SODIUM CHLORIDE 0.9 % IV BOLUS
1000.0000 mL | Freq: Once | INTRAVENOUS | Status: AC
  Administered 2019-02-04: 14:00:00 1000 mL via INTRAVENOUS

## 2019-02-04 MED ORDER — ONDANSETRON HCL 4 MG/2ML IJ SOLN: 4.0000 mg | Freq: Once | INTRAMUSCULAR | Status: DC

## 2019-02-04 MED ORDER — ONDANSETRON HCL 4 MG/2ML IJ SOLN
4.0000 mg | Freq: Once | INTRAMUSCULAR | Status: AC
  Administered 2019-02-04: 14:00:00 4 mg via INTRAVENOUS
  Filled 2019-02-04: qty 2

## 2019-02-04 MED ORDER — MORPHINE SULFATE (PF) 2 MG/ML IV SOLN
2.0000 mg | Freq: Once | INTRAVENOUS | Status: AC
  Administered 2019-02-04: 2 mg via INTRAVENOUS
  Filled 2019-02-04: qty 1

## 2019-02-04 MED ORDER — SODIUM CHLORIDE 0.9 % IV BOLUS: 1000.0000 mL | Freq: Once | INTRAVENOUS | Status: DC

## 2019-02-04 NOTE — ED Notes (Signed)
Pt given a ginger ale and bag lunch, is tolerating well.

## 2019-02-04 NOTE — Discharge Instructions (Signed)
Continue Ibuprofen or Naproxen for pain Take Tramadol as needed for severe pain Take Zofran as needed for nausea Please follow up with OBGYN

## 2019-02-04 NOTE — ED Triage Notes (Addendum)
Pt reports hx of uterine fibroids, states she is currently on her menstrual cycle and having severe lower abd pain and vomiting. Denies fevers or chills. Pt tearful during triage.

## 2019-02-04 NOTE — ED Provider Notes (Signed)
[facility] EMERGENCY DEPARTMENT Provider Note   CSN: [id number] Arrival date & time: 02/04/19  1207     History   Chief Complaint Chief Complaint  Patient presents with  . Abdominal Pain    HPI Valerie Peters is a 28 y.o. female with history of fibroids and dysmenorrhea who presents with pelvic pain, nausea and vomiting.  Patient states that she has been here for this problem multiple times before.  She has severely painful periods and for the first 3 days she will have pelvic pain, vaginal bleeding, and nausea and vomiting.  She is currently being followed by OB/GYN and was started on a new birth control.  She was previously taking Sprintec but this caused her to have abnormal heavy bleeding so she just changed to a new birth control.  She has been taking naproxen without significant relief and does not want to take it because she is not able to tolerate p.o. and does not want her stomach to get more upset.  She denies fever, chills, diarrhea, constipation, urinary symptoms or vaginal discharge.  She denies any concern for STD and states that this is the same issue that she has been having multiple times in the past.     HPI  Past Medical History:  Diagnosis Date  . Abnormal uterine bleeding   . Anxiety   . Depression   . Dysmenorrhea   . Heart murmur   . HSV-1 (herpes simplex virus 1) infection     Patient Active Problem List   Diagnosis Date Noted  . Fibroids, submucosal 05/03/2018  . Menorrhagia with regular cycle 05/03/2018    Past Surgical History:  Procedure Laterality Date  . NO PAST SURGERIES       OB History    Gravida  0   Para  0   Term  0   Preterm  0   AB  0   Living  0     SAB  0   TAB  0   Ectopic  0   Multiple  0   Live Births  0            Home Medications    Prior to Admission medications   Medication Sig Start Date End Date Taking? Authorizing Provider  norethindrone-ethinyl estradiol 1/35 ([place]  1/35) tablet Take 1 tablet by mouth daily. 01/29/19  Yes Anyanwu, Sallyanne Havers, MD  traMADol (ULTRAM) 50 MG tablet Take 1 tablet (50 mg total) by mouth every 6 (six) hours as needed for severe pain. 11/13/18   Frederica Kuster, PA-C    Family History Family History  Problem Relation Age of Onset  . Hypertension Father     Social History Social History   Tobacco Use  . Smoking status: Never Smoker  . Smokeless tobacco: Never Used  Substance Use Topics  . Alcohol use: Yes    Comment: occ glass of wine  . Drug use: Never     Allergies   Lactose intolerance (gi)   Review of Systems Review of Systems  Constitutional: Negative for chills and fever.  Respiratory: Negative for shortness of breath.   Cardiovascular: Negative for chest pain.  Gastrointestinal: Positive for abdominal pain, nausea and vomiting. Negative for constipation and diarrhea.  Genitourinary: Positive for pelvic pain and vaginal bleeding. Negative for dysuria and vaginal discharge.  All other systems reviewed and are negative.    Physical Exam Updated Vital Signs BP (!) 157/110 (BP Location: Left Arm)   Pulse  76   Temp 98.2 F (36.8 C) (Oral)   Resp 16   LMP 02/04/2019   SpO2 100%   Physical Exam Vitals signs and nursing note reviewed.  Constitutional:      General: She is in acute distress.     Appearance: She is well-developed. She is not ill-appearing.     Comments: Crying, cooperative  HENT:     Head: Normocephalic and atraumatic.  Eyes:     General: No scleral icterus.       Right eye: No discharge.        Left eye: No discharge.     Conjunctiva/sclera: Conjunctivae normal.     Pupils: Pupils are equal, round, and reactive to light.  Neck:     Musculoskeletal: Normal range of motion.  Cardiovascular:     Rate and Rhythm: Normal rate and regular rhythm.  Pulmonary:     Effort: Pulmonary effort is normal. No respiratory distress.     Breath sounds: Normal breath sounds.  Abdominal:      General: There is no distension.     Palpations: Abdomen is soft.     Tenderness: There is abdominal tenderness (diffuse lower pelvic tenderness).  Genitourinary:    Comments: Pelvic: deferred Skin:    General: Skin is warm and dry.  Neurological:     Mental Status: She is alert and oriented to person, place, and time.  Psychiatric:        Behavior: Behavior normal.      ED Treatments / Results  Labs (all labs ordered are listed, but only abnormal results are displayed) Labs Reviewed  COMPREHENSIVE METABOLIC PANEL - Abnormal; Notable for the following components:      Result Value   Glucose, Bld 116 (*)    BUN 5 (*)    Total Bilirubin 2.1 (*)    All other components within normal limits  CBC - Abnormal; Notable for the following components:   WBC 12.2 (*)    RDW 11.4 (*)    All other components within normal limits  URINALYSIS, ROUTINE W REFLEX MICROSCOPIC - Abnormal; Notable for the following components:   Color, Urine AMBER (*)    APPearance HAZY (*)    Hgb urine dipstick LARGE (*)    Ketones, ur 80 (*)    Protein, ur 30 (*)    RBC / HPF >50 (*)    All other components within normal limits  I-STAT BETA HCG BLOOD, ED ([facility], [facility], [facility] ONLY)    EKG None  Radiology No results found.  Procedures Procedures (including critical care time)  Medications Ordered in ED Medications  ketorolac (TORADOL) 15 MG/ML injection 15 mg (15 mg Intravenous Given 02/04/19 1416)  ondansetron (ZOFRAN) injection 4 mg (4 mg Intravenous Given 02/04/19 1416)  sodium chloride 0.9 % bolus 1,000 mL (0 mLs Intravenous Stopped 02/04/19 1537)  morphine 2 MG/ML injection 2 mg (2 mg Intravenous Given 02/04/19 1537)     Initial Impression / Assessment and Plan / ED Course  I have reviewed the triage vital signs and the nursing notes.  Pertinent labs & imaging results that were available during my care of the patient were reviewed by me and considered in my medical decision making (see chart for  details).  28 year old female presents with acute pelvic pain, nausea and vomiting which started during her menstrual cycle.  Her vitals are normal.  This is a recurrent problem for her and she has been followed by OB/GYN.  Home medicines are not  helping her today.  She is distressed on initial exam has diffuse lower abdominal tenderness.  We will give her pain control with Toradol, fluids, Zofran.  Since this is a recurrent problem I do not think she needs a pelvic exam or ultrasound today.  We will focus on symptomatic control  Labs obtained shows mild leukocytosis but otherwise is reassuring.  UA has hemoglobin which is likely contaminant from her menstrual cycle and 80 ketones.  I rechecked the patient and she is feeling much better but still having pain at 6 out of 10.  We will give a low-dose of morphine and recheck  Rechecked patient again.  She is sitting up, alert, smiling, tolerating p.o.  She states that tramadol has worked well for her in the past which she uses when the NSAIDs are not helping her.  Will prescribe a short course of tramadol and Zofran and encouraged her to follow-up with OB/GYN.  Final Clinical Impressions(s) / ED Diagnoses   Final diagnoses:  Dysmenorrhea    ED Discharge Orders    None       Recardo Evangelist, PA-C 02/04/19 1831    Mesner, Corene Cornea, MD 02/04/19 2044

## 2019-03-15 DIAGNOSIS — N870 Mild cervical dysplasia: Secondary | ICD-10-CM

## 2019-03-15 HISTORY — DX: Mild cervical dysplasia: N87.0

## 2019-03-20 ENCOUNTER — Ambulatory Visit: Payer: Medicaid Other | Admitting: Obstetrics & Gynecology

## 2019-03-25 ENCOUNTER — Ambulatory Visit: Payer: Medicaid Other | Admitting: Women's Health

## 2019-04-11 ENCOUNTER — Other Ambulatory Visit: Payer: Self-pay

## 2019-04-15 ENCOUNTER — Ambulatory Visit: Payer: Medicaid Other | Admitting: Obstetrics and Gynecology

## 2019-04-15 NOTE — Progress Notes (Signed)
29 y.o. G0P0000 Single African American female here for annual exam.    Patient has fibroids and dysmenorrhea.  Her [place] 04/09/18 showed a submucosal fibroid within the anterior mid uterus 2.3 cm.  Ovaries were normal.  She has been seen by the teaching service.  She states her pain is now occurring all the time.  States her pain is affecting her mood.  She reports she is feeling helpless.  She denies suicidal ideation.  She is trying to stay active and doing boxing classes.   She is taking her birth control and this is helping to control her pain.  If she takes it late, she has spotting.  States her bleeding is well controlled.  She skips her cycle, this controls her pain but she still has PMS symptoms.  Her PMS symptoms are upset stomach, she is more sad and crying, difficulty controlling her emotions, she has back pain and nausea, and decreased energy.   She states her life would be great if not for her pain.  She states her pain is affecting her job at [employer]. She reports she is in a good relationship.  She would like to have a family in the next 5 years.   PCP:   None.  Patient's last menstrual period was 04/01/2019 (exact date).     Period Cycle (Days): 28 Period Duration (Days): 7 Period Pattern: Regular Menstrual Control: Maxi pad Menstrual Control Change Freq (Hours): 2-3 hours Dysmenorrhea: (!) Severe Dysmenorrhea Symptoms: Cramping     Sexually active: Yes.    The current method of family planning is OCP (estrogen/progesterone).    Exercising: Yes.    Home exercise routine includes boxing one a week. Smoker:  no  Health Maintenance: Pap: 02-07-18 Neg, 2017 Neg per patient History of abnormal Pap:  Yes, age 20 had colposcopy but no treatment. MMG:  n/a Colonoscopy:  n/a BMD:   n/a  Result  n/a TDaP: had in last couple years per pt. Gardasil:   yes HIV: 01-19-18 NR Hep C: 01-19-18 Neg Screening Labs:  Hb today     reports that she has never smoked. She  has never used smokeless tobacco. She reports current alcohol use. She reports that she does not use drugs.  Past Medical History:  Diagnosis Date  . Abnormal uterine bleeding   . Anxiety   . Depression   . Dysmenorrhea   . Heart murmur   . HSV-1 (herpes simplex virus 1) infection     Past Surgical History:  Procedure Laterality Date  . NO PAST SURGERIES      Current Outpatient Medications  Medication Sig Dispense Refill  . norethindrone-ethinyl estradiol 1/35 ([place] 1/35) tablet Take 1 tablet by mouth daily. 1 Package 11  . traMADol (ULTRAM) 50 MG tablet Take 1 tablet (50 mg total) by mouth every 6 (six) hours as needed. 15 tablet 0  . ondansetron (ZOFRAN ODT) 4 MG disintegrating tablet Take 1 tablet (4 mg total) by mouth every 8 (eight) hours as needed for nausea or vomiting. (Patient not taking: Reported on 04/16/2019) 8 tablet 0   No current facility-administered medications for this visit.    Family History  Problem Relation Age of Onset  . Hypertension Father     Review of Systems  Genitourinary: Positive for pelvic pain.    Exam:   BP 100/65 (BP Location: Right Arm, Patient Position: Sitting, Cuff Size: Normal)   Pulse 78   Temp (!) 97.3 F (36.3 C) (Temporal)   Ht  5\' 8"  (1.727 m)   Wt 144 lb 12.8 oz (65.7 kg)   LMP 04/01/2019 (Exact Date)   BMI 22.02 kg/m     General appearance: alert, cooperative and appears stated age Head: normocephalic, without obvious abnormality, atraumatic Neck: no adenopathy, supple, symmetrical, trachea midline and thyroid normal to inspection and palpation Lungs: clear to auscultation bilaterally Breasts: normal appearance, no masses or tenderness, No nipple retraction or dimpling, No nipple discharge or bleeding, No axillary adenopathy Heart: regular rate and rhythm Abdomen: soft, non-tender; no masses, no organomegaly Extremities: extremities normal, atraumatic, no cyanosis or edema Skin: skin color, texture, turgor normal. No  rashes or lesions Lymph nodes: cervical, supraclavicular, and axillary nodes normal. Neurologic: grossly normal  Pelvic: External genitalia:  no lesions              No abnormal inguinal nodes palpated.              Urethra:  normal appearing urethra with no masses, tenderness or lesions              Bartholins and Skenes: normal                 Vagina: normal appearing vagina with normal color and discharge, no lesions              Cervix: no lesions              Pap taken: Yes.   Bimanual Exam:  Uterus:  normal size, contour, position, consistency, mobility, non-tender              Adnexa: no mass, fullness, tenderness             Chaperone was present for exam.  Assessment:   Well woman visit with normal exam. Fibroids.  Dysmenorrhea. Hx HSV I. PMS.  STD screening.   Plan: Mammogram screening discussed. Self breast awareness reviewed. Pap and HR HPV as above. Guidelines for Calcium, Vitamin D, regular exercise program including cardiovascular and weight bearing exercise. Start Zoloft 50 mg daily.  We discussed alternatives to her COCs - Depo Lupron, Freida Busman, Depo Lupron, surgical care. Continue OCPs continuously.   STD screening and routine labs including A1C. FU in 6 weeks.  Follow up annually and prn.   After visit summary provided.

## 2019-04-16 ENCOUNTER — Other Ambulatory Visit (HOSPITAL_COMMUNITY)
Admission: RE | Admit: 2019-04-16 | Discharge: 2019-04-16 | Disposition: A | Discharge status: Home / Self Care | Payer: Medicaid Other | Source: Ambulatory Visit | Attending: Obstetrics and Gynecology | Admitting: Obstetrics and Gynecology

## 2019-04-16 ENCOUNTER — Encounter: Payer: Self-pay | Admitting: Obstetrics and Gynecology

## 2019-04-16 ENCOUNTER — Ambulatory Visit (INDEPENDENT_AMBULATORY_CARE_PROVIDER_SITE_OTHER): Payer: BC Managed Care – PPO | Admitting: Obstetrics and Gynecology

## 2019-04-16 ENCOUNTER — Other Ambulatory Visit: Payer: Self-pay

## 2019-04-16 VITALS — BP 100/65 | HR 78 | Temp 97.3°F | Ht 68.0 in | Wt 144.8 lb

## 2019-04-16 DIAGNOSIS — Z113 Encounter for screening for infections with a predominantly sexual mode of transmission: Secondary | ICD-10-CM | POA: Insufficient documentation

## 2019-04-16 DIAGNOSIS — R7989 Other specified abnormal findings of blood chemistry: Secondary | ICD-10-CM | POA: Diagnosis not present

## 2019-04-16 DIAGNOSIS — Z01419 Encounter for gynecological examination (general) (routine) without abnormal findings: Secondary | ICD-10-CM | POA: Diagnosis not present

## 2019-04-16 DIAGNOSIS — N946 Dysmenorrhea, unspecified: Secondary | ICD-10-CM | POA: Diagnosis not present

## 2019-04-16 MED ORDER — SERTRALINE HCL 50 MG PO TABS: 50.0000 mg | ORAL_TABLET | Freq: Every day | ORAL | 1 refills | Status: DC

## 2019-04-16 MED ORDER — PIRMELLA 1/35 1-35 MG-MCG PO TABS: 1.0000 | ORAL_TABLET | Freq: Every day | ORAL | 3 refills | Status: DC

## 2019-04-16 NOTE — Patient Instructions (Signed)
EXERCISE AND DIET:  We recommended that you start or continue a regular exercise program for good health. Regular exercise means any activity that makes your heart beat faster and makes you sweat.  We recommend exercising at least 30 minutes per day at least 3 days a week, preferably 4 or 5.  We also recommend a diet low in fat and sugar.  Inactivity, poor dietary choices and obesity can cause diabetes, heart attack, stroke, and kidney damage, among others.    ALCOHOL AND SMOKING:  Women should limit their alcohol intake to no more than 7 drinks/beers/glasses of wine (combined, not each!) per week. Moderation of alcohol intake to this level decreases your risk of breast cancer and liver damage. And of course, no recreational drugs are part of a healthy lifestyle.  And absolutely no smoking or even second hand smoke. Most people know smoking can cause heart and lung diseases, but did you know it also contributes to weakening of your bones? Aging of your skin?  Yellowing of your teeth and nails?  CALCIUM AND VITAMIN D:  Adequate intake of calcium and Vitamin D are recommended.  The recommendations for exact amounts of these supplements seem to change often, but generally speaking 600 mg of calcium (either carbonate or citrate) and 800 units of Vitamin D per day seems prudent. Certain women may benefit from higher intake of Vitamin D.  If you are among these women, your doctor will have told you during your visit.    PAP SMEARS:  Pap smears, to check for cervical cancer or precancers,  have traditionally been done yearly, although recent scientific advances have shown that most women can have pap smears less often.  However, every woman still should have a physical exam from her gynecologist every year. It will include a breast check, inspection of the vulva and vagina to check for abnormal growths or skin changes, a visual exam of the cervix, and then an exam to evaluate the size and shape of the uterus and  ovaries.  And after 31 years of age, a rectal exam is indicated to check for rectal cancers. We will also provide age appropriate advice regarding health maintenance, like when you should have certain vaccines, screening for sexually transmitted diseases, bone density testing, colonoscopy, mammograms, etc.   MAMMOGRAMS:  All women over 40 years old should have a yearly mammogram. Many facilities now offer a "3D" mammogram, which may cost around $50 extra out of pocket. If possible,  we recommend you accept the option to have the 3D mammogram performed.  It both reduces the number of women who will be called back for extra views which then turn out to be normal, and it is better than the routine mammogram at detecting truly abnormal areas.    COLONOSCOPY:  Colonoscopy to screen for colon cancer is recommended for all women at age 50.  We know, you hate the idea of the prep.  We agree, BUT, having colon cancer and not knowing it is worse!!  Colon cancer so often starts as a polyp that can be seen and removed at colonscopy, which can quite literally save your life!  And if your first colonoscopy is normal and you have no family history of colon cancer, most women don't have to have it again for 10 years.  Once every ten years, you can do something that may end up saving your life, right?  We will be happy to help you get it scheduled when you are ready.    Be sure to check your insurance coverage so you understand how much it will cost.  It may be covered as a preventative service at no cost, but you should check your particular policy.     Sertraline tablets What is this medicine? SERTRALINE (SER tra leen) is used to treat depression. It may also be used to treat obsessive compulsive disorder, panic disorder, post-trauma stress, premenstrual dysphoric disorder (PMDD) or social anxiety. This medicine may be used for other purposes; ask your health care provider or pharmacist if you have questions. COMMON BRAND  NAME(S): Zoloft What should I tell my health care provider before I take this medicine? They need to know if you have any of these conditions:  bleeding disorders  bipolar disorder or a family history of bipolar disorder  glaucoma  heart disease  high blood pressure  history of irregular heartbeat  history of low levels of calcium, magnesium, or potassium in the blood  if you often drink alcohol  liver disease  receiving electroconvulsive therapy  seizures  suicidal thoughts, plans, or attempt; a previous suicide attempt by you or a family member  take medicines that treat or prevent blood clots  thyroid disease  an unusual or allergic reaction to sertraline, other medicines, foods, dyes, or preservatives  pregnant or trying to get pregnant  breast-feeding How should I use this medicine? Take this medicine by mouth with a glass of water. Follow the directions on the prescription label. You can take it with or without food. Take your medicine at regular intervals. Do not take your medicine more often than directed. Do not stop taking this medicine suddenly except upon the advice of your doctor. Stopping this medicine too quickly may cause serious side effects or your condition may worsen. A special MedGuide will be given to you by the pharmacist with each prescription and refill. Be sure to read this information carefully each time. Talk to your pediatrician regarding the use of this medicine in children. While this drug may be prescribed for children as young as 7 years for selected conditions, precautions do apply. Overdosage: If you think you have taken too much of this medicine contact a poison control center or emergency room at once. NOTE: This medicine is only for you. Do not share this medicine with others. What if I miss a dose? If you miss a dose, take it as soon as you can. If it is almost time for your next dose, take only that dose. Do not take double or extra  doses. What may interact with this medicine? Do not take this medicine with any of the following medications:  cisapride  dronedarone  linezolid  MAOIs like Carbex, Eldepryl, Marplan, Nardil, and Parnate  methylene blue (injected into a vein)  pimozide  thioridazine This medicine may also interact with the following medications:  alcohol  amphetamines  aspirin and aspirin-like medicines  certain medicines for depression, anxiety, or psychotic disturbances  certain medicines for fungal infections like ketoconazole, fluconazole, posaconazole, and itraconazole  certain medicines for irregular heart beat like flecainide, quinidine, propafenone  certain medicines for migraine headaches like almotriptan, eletriptan, frovatriptan, naratriptan, rizatriptan, sumatriptan, zolmitriptan  certain medicines for sleep  certain medicines for seizures like carbamazepine, valproic acid, phenytoin  certain medicines that treat or prevent blood clots like warfarin, enoxaparin, dalteparin  cimetidine  digoxin  diuretics  fentanyl  isoniazid  lithium  NSAIDs, medicines for pain and inflammation, like ibuprofen or naproxen  other medicines that prolong the QT interval (  cause an abnormal heart rhythm) like dofetilide  rasagiline  safinamide  supplements like St. John's wort, kava kava, valerian  tolbutamide  tramadol  tryptophan This list may not describe all possible interactions. Give your health care provider a list of all the medicines, herbs, non-prescription drugs, or dietary supplements you use. Also tell them if you smoke, drink alcohol, or use illegal drugs. Some items may interact with your medicine. What should I watch for while using this medicine? Tell your doctor if your symptoms do not get better or if they get worse. Visit your doctor or health care professional for regular checks on your progress. Because it may take several weeks to see the full effects  of this medicine, it is important to continue your treatment as prescribed by your doctor. Patients and their families should watch out for new or worsening thoughts of suicide or depression. Also watch out for sudden changes in feelings such as feeling anxious, agitated, panicky, irritable, hostile, aggressive, impulsive, severely restless, overly excited and hyperactive, or not being able to sleep. If this happens, especially at the beginning of treatment or after a change in dose, call your health care professional. Dennis Bast may get drowsy or dizzy. Do not drive, use machinery, or do anything that needs mental alertness until you know how this medicine affects you. Do not stand or sit up quickly, especially if you are an older patient. This reduces the risk of dizzy or fainting spells. Alcohol may interfere with the effect of this medicine. Avoid alcoholic drinks. Your mouth may get dry. Chewing sugarless gum or sucking hard candy, and drinking plenty of water may help. Contact your doctor if the problem does not go away or is severe. What side effects may I notice from receiving this medicine? Side effects that you should report to your doctor or health care professional as soon as possible:  allergic reactions like skin rash, itching or hives, swelling of the face, lips, or tongue  anxious  black, tarry stools  changes in vision  confusion  elevated mood, decreased need for sleep, racing thoughts, impulsive behavior  eye pain  fast, irregular heartbeat  feeling faint or lightheaded, falls  feeling agitated, angry, or irritable  hallucination, loss of contact with reality  loss of balance or coordination  loss of memory  painful or prolonged erections  restlessness, pacing, inability to keep still  seizures  stiff muscles  suicidal thoughts or other mood changes  trouble sleeping  unusual bleeding or bruising  unusually weak or tired  vomiting Side effects that  usually do not require medical attention (report to your doctor or health care professional if they continue or are bothersome):  change in appetite or weight  change in sex drive or performance  diarrhea  increased sweating  indigestion, nausea  tremors This list may not describe all possible side effects. Call your doctor for medical advice about side effects. You may report side effects to FDA at 1-800-FDA-1088. Where should I keep my medicine? Keep out of the reach of children. Store at room temperature between 15 and 30 degrees C (59 and 86 degrees F). Throw away any unused medicine after the expiration date. NOTE: This sheet is a summary. It may not cover all possible information. If you have questions about this medicine, talk to your doctor, pharmacist, or health care provider.  2020 Elsevier/Gold Standard (2018-02-20 10:09:27)

## 2019-04-17 ENCOUNTER — Encounter: Payer: Self-pay | Admitting: Obstetrics and Gynecology

## 2019-04-17 ENCOUNTER — Telehealth: Payer: Self-pay | Admitting: Obstetrics and Gynecology

## 2019-04-17 DIAGNOSIS — R7989 Other specified abnormal findings of blood chemistry: Secondary | ICD-10-CM

## 2019-04-17 LAB — CBC WITH DIFFERENTIAL/PLATELET
Basophils Absolute: 0.1 10*3/uL (ref 0.0–0.2)
Basos: 1 %
EOS (ABSOLUTE): 0.1 10*3/uL (ref 0.0–0.4)
Eos: 1 %
Hematocrit: 43 % (ref 34.0–46.6)
Hemoglobin: 14.6 g/dL (ref 11.1–15.9)
Immature Grans (Abs): 0 10*3/uL (ref 0.0–0.1)
Immature Granulocytes: 0 %
Lymphocytes Absolute: 1.5 10*3/uL (ref 0.7–3.1)
Lymphs: 18 %
MCH: 33.8 pg — ABNORMAL HIGH (ref 26.6–33.0)
MCHC: 34 g/dL (ref 31.5–35.7)
MCV: 100 fL — ABNORMAL HIGH (ref 79–97)
Monocytes Absolute: 0.6 10*3/uL (ref 0.1–0.9)
Monocytes: 8 %
Neutrophils Absolute: 6.3 10*3/uL (ref 1.4–7.0)
Neutrophils: 72 %
Platelets: 267 10*3/uL (ref 150–450)
RBC: 4.32 x10E6/uL (ref 3.77–5.28)
RDW: 11.6 % — ABNORMAL LOW (ref 11.7–15.4)
WBC: 8.6 10*3/uL (ref 3.4–10.8)

## 2019-04-17 LAB — HEP, RPR, HIV PANEL
HIV Screen 4th Generation wRfx: NONREACTIVE
Hepatitis B Surface Ag: NEGATIVE
RPR Ser Ql: NONREACTIVE

## 2019-04-17 LAB — LIPID PANEL
Chol/HDL Ratio: 2.6 ratio (ref 0.0–4.4)
Cholesterol, Total: 109 mg/dL (ref 100–199)
HDL: 42 mg/dL (ref >39)
LDL Chol Calc (NIH): 53 mg/dL (ref 0–99)
Triglycerides: 64 mg/dL (ref 0–149)
VLDL Cholesterol Cal: 14 mg/dL (ref 5–40)

## 2019-04-17 LAB — COMPREHENSIVE METABOLIC PANEL
ALT: 16 IU/L (ref 0–32)
AST: 17 IU/L (ref 0–40)
Albumin/Globulin Ratio: 1.7 (ref 1.2–2.2)
Albumin: 4.3 g/dL (ref 3.9–5.0)
Alkaline Phosphatase: 65 IU/L (ref 39–117)
BUN/Creatinine Ratio: 8 — ABNORMAL LOW (ref 9–23)
BUN: 6 mg/dL (ref 6–20)
Bilirubin Total: 1 mg/dL (ref 0.0–1.2)
CO2: 20 mmol/L (ref 20–29)
Calcium: 9.1 mg/dL (ref 8.7–10.2)
Chloride: 106 mmol/L (ref 96–106)
Creatinine, Ser: 0.79 mg/dL (ref 0.57–1.00)
GFR calc Af Amer: 116 mL/min/{1.73_m2} (ref >59)
GFR calc non Af Amer: 101 mL/min/{1.73_m2} (ref >59)
Globulin, Total: 2.6 g/dL (ref 1.5–4.5)
Glucose: 85 mg/dL (ref 65–99)
Potassium: 4.4 mmol/L (ref 3.5–5.2)
Sodium: 139 mmol/L (ref 134–144)
Total Protein: 6.9 g/dL (ref 6.0–8.5)

## 2019-04-17 LAB — VITAMIN D 25 HYDROXY (VIT D DEFICIENCY, FRACTURES): Vit D, 25-Hydroxy: 13.2 ng/mL — ABNORMAL LOW (ref 30.0–100.0)

## 2019-04-17 LAB — HEMOGLOBIN A1C
Est. average glucose Bld gHb Est-mCnc: 97 mg/dL
Hgb A1c MFr Bld: 5 % (ref 4.8–5.6)

## 2019-04-17 LAB — TSH: TSH: 3.19 u[IU]/mL (ref 0.450–4.500)

## 2019-04-17 LAB — HEPATITIS C ANTIBODY: Hep C Virus Ab: <0.1 s/co ratio (ref 0.0–0.9)

## 2019-04-17 MED ORDER — VITAMIN D (ERGOCALCIFEROL) 1.25 MG (50000 UNIT) PO CAPS: 50000.0000 [IU] | ORAL_CAPSULE | ORAL | 0 refills | Status: DC

## 2019-04-17 NOTE — Addendum Note (Signed)
Addended by: Yisroel Ramming, Benford Asch E on: 04/17/2019 10:05 AM   Modules accepted: Orders

## 2019-04-17 NOTE — Telephone Encounter (Signed)
Pt given lab results. Pt aware of taking Rx Vit D which was sent to pharmacy on file. #12. 0RF Lab appt made for 07/15/2019 at 845am.  Pt states having diarrhea, vomiting x 1- did eat dairy yesterday, which "is not good for me" since being lactose intolerant. Pt wanting to know if is side effect from Zoloft that she started taking yesterday?. Pt did eat today well.   Pt also stating " could I be pregnant due to sx of vomiting?" Pt encouraged to take home UPT. Pt agreeable. Will call office if positive.   Routing to Dr Quincy Simmonds for review and recommendations.

## 2019-04-17 NOTE — Telephone Encounter (Signed)
Her GI symptoms may be due to the lactose intolerance or due to the Zoloft, or both.  I recommend she cut the Zoloft in half and take 25 mg daily for one week and then increase to the 50 gm daily if she is tolerating this well.

## 2019-04-17 NOTE — Telephone Encounter (Signed)
Patient sent the following correspondence through [place].  I have a question about VITAMIN D 25 HYDROXY (VIT D DEFICIENCY, FRACTURES) resulted on 04/17/19, 9:36 AM.  What can I do to treat this deficiency? What is the best vitamin, I should use?

## 2019-04-18 ENCOUNTER — Telehealth: Payer: Self-pay | Admitting: Obstetrics and Gynecology

## 2019-04-18 ENCOUNTER — Encounter: Payer: Self-pay | Admitting: Obstetrics and Gynecology

## 2019-04-18 DIAGNOSIS — R87810 Cervical high risk human papillomavirus (HPV) DNA test positive: Secondary | ICD-10-CM

## 2019-04-18 DIAGNOSIS — R87610 Atypical squamous cells of undetermined significance on cytologic smear of cervix (ASC-US): Secondary | ICD-10-CM

## 2019-04-18 LAB — CYTOLOGY - PAP
Chlamydia: NEGATIVE
Comment: NEGATIVE
Comment: NEGATIVE
Comment: NEGATIVE
Comment: NORMAL
Diagnosis: UNDETERMINED — AB
High risk HPV: POSITIVE — AB
Neisseria Gonorrhea: NEGATIVE
Trichomonas: NEGATIVE

## 2019-04-18 NOTE — Telephone Encounter (Signed)
Spoke to pt. Pt given recommendations by Dr Quincy Simmonds to take 25mg  of Zoloft for 1 week then increase back to 50mg  once tolerating. Pt agreeable and verbalized understanding.  Routing to Dr Quincy Simmonds for review and will close encounter.

## 2019-04-18 NOTE — Telephone Encounter (Signed)
Routing to Dr. Quincy Simmonds to review 04/16/19 pap results and advise.

## 2019-04-18 NOTE — Telephone Encounter (Signed)
,   [facility]  Phone Number: [phone]  I have a question about CYTOLOGY - PAP resulted on 04/18/19, 12:38 PM.   I can see the results for positive at High Risk HPV... Does this mean I have HPV or just at a very high risk and what does this mean? What do I do?   Thank you

## 2019-04-19 NOTE — Telephone Encounter (Signed)
Her pap indicates she has atypical cells on her cervix and also the presence of high risk HPV.  I recommend colposcopy with me in the office for further evaluation.  I anticipate biopsies of the cervix in order to give a clear diagnosis regarding her cervical status and to determine if she needs any treatment of just follow up.  No anesthesia is necessary, but Ibuprofen 800 mg prior to the procedure can reduce after cramping.   Please send for precert.   Her testing is negative for gonorrhea, chlamydia, and trichomonas.

## 2019-04-19 NOTE — Telephone Encounter (Signed)
Spoke with patient. Advised of results as seen below per Dr. Quincy Simmonds. Explanation of colpo provided. Additional explanation of pap and HPV provided. Patient has additional questions to discuss regarding pap and hpv. RN recommended MyChart visit for further discussion with Dr. Quincy Simmonds, patient agreeable. MyChart visit scheduled for 2/8 at 4:30pm.   Order placed for colpo for precert.   35mo recall placed.   Routing to provider for final review. Patient is agreeable to disposition. Will close encounter.  Cc: Magdalene Patricia, Hayley Carder

## 2019-04-22 ENCOUNTER — Telehealth (INDEPENDENT_AMBULATORY_CARE_PROVIDER_SITE_OTHER): Payer: BC Managed Care – PPO | Admitting: Obstetrics and Gynecology

## 2019-04-22 ENCOUNTER — Encounter: Payer: Self-pay | Admitting: Obstetrics and Gynecology

## 2019-04-22 DIAGNOSIS — R87610 Atypical squamous cells of undetermined significance on cytologic smear of cervix (ASC-US): Secondary | ICD-10-CM | POA: Diagnosis not present

## 2019-04-22 DIAGNOSIS — N946 Dysmenorrhea, unspecified: Secondary | ICD-10-CM

## 2019-04-22 DIAGNOSIS — R87810 Cervical high risk human papillomavirus (HPV) DNA test positive: Secondary | ICD-10-CM | POA: Diagnosis not present

## 2019-04-22 MED ORDER — PIRMELLA 1/35 1-35 MG-MCG PO TABS: 1.0000 | ORAL_TABLET | Freq: Every day | ORAL | 3 refills | Status: DC

## 2019-04-22 NOTE — Progress Notes (Signed)
GYNECOLOGY  VISIT   HPI: 29 y.o.   Single  African American  female   [place] with Patient's last menstrual period was 04/01/2019 (exact date).   here for MyChart video regarding pap smear results.    She gives permission for the My Chart visit organized by Glorianne Manchester, RN. I am in my office.  She is outside of her job in her car.  Started at 4:58 and lasted 18:59 minutes.   Her recent pap showed ASCUS and positive HR HPV.  This is her first abnormal pap.   She wants to take her pills continuously for 3 months but is having difficulty getting her pharmacy to fill her prescription in this fashion.   She did not complete Gardasil series.  GYNECOLOGIC HISTORY: Patient's last menstrual period was 04/01/2019 (exact date). Contraception:  OCPs Menopausal hormone therapy:  n/a Last mammogram:  n/a Last pap smear: 04-16-19 ASCUS:Pos HR HPV,02-07-18 Neg, 2017 Neg per patient        OB History    Gravida  0   Para  0   Term  0   Preterm  0   AB  0   Living  0     SAB  0   TAB  0   Ectopic  0   Multiple  0   Live Births  0              Patient Active Problem List   Diagnosis Date Noted  . Fibroids, submucosal 05/03/2018  . Menorrhagia with regular cycle 05/03/2018    Past Medical History:  Diagnosis Date  . Abnormal uterine bleeding   . Anxiety   . Depression   . Dysmenorrhea   . Heart murmur   . HSV-1 (herpes simplex virus 1) infection     Past Surgical History:  Procedure Laterality Date  . NO PAST SURGERIES      Current Outpatient Medications  Medication Sig Dispense Refill  . norethindrone-ethinyl estradiol 1/35 ([place] 1/35) tablet Take 1 tablet by mouth daily. Take continuously for 9 weeks and then take reminder pills for one week to have your menstrual period. 3 Package 3  . ondansetron (ZOFRAN ODT) 4 MG disintegrating tablet Take 1 tablet (4 mg total) by mouth every 8 (eight) hours as needed for nausea or vomiting. (Patient not taking:  Reported on 04/16/2019) 8 tablet 0  . sertraline (ZOLOFT) 50 MG tablet Take 1 tablet (50 mg total) by mouth daily. 30 tablet 1  . traMADol (ULTRAM) 50 MG tablet Take 1 tablet (50 mg total) by mouth every 6 (six) hours as needed. 15 tablet 0  . Vitamin D, Ergocalciferol, (DRISDOL) 1.25 MG (50000 UNIT) CAPS capsule Take 1 capsule (50,000 Units total) by mouth every 7 (seven) days. 12 capsule 0   No current facility-administered medications for this visit.     ALLERGIES: Lactose intolerance (gi)  Family History  Problem Relation Age of Onset  . Hypertension Father     Social History   Socioeconomic History  . Marital status: Single    Spouse name: Not on file  . Number of children: Not on file  . Years of education: Not on file  . Highest education level: Not on file  Occupational History  . Not on file  Tobacco Use  . Smoking status: Never Smoker  . Smokeless tobacco: Never Used  Substance and Sexual Activity  . Alcohol use: Yes    Comment: occ glass of wine  . Drug use: Never  .  Sexual activity: Yes    Birth control/protection: Pill    Comment: Orthotricyclen Lo  Other Topics Concern  . Not on file  Social History Narrative  . Not on file   Social Determinants of Health   Financial Resource Strain:   . Difficulty of Paying Living Expenses: Not on file  Food Insecurity:   . Worried About Charity fundraiser in the Last Year: Not on file  . Ran Out of Food in the Last Year: Not on file  Transportation Needs:   . Lack of Transportation (Medical): Not on file  . Lack of Transportation (Non-Medical): Not on file  Physical Activity:   . Days of Exercise per Week: Not on file  . Minutes of Exercise per Session: Not on file  Stress:   . Feeling of Stress : Not on file  Social Connections:   . Frequency of Communication with Friends and Family: Not on file  . Frequency of Social Gatherings with Friends and Family: Not on file  . Attends Religious Services: Not on file   . Active Member of Clubs or Organizations: Not on file  . Attends Archivist Meetings: Not on file  . Marital Status: Not on file  Intimate Partner Violence:   . Fear of Current or Ex-Partner: Not on file  . Emotionally Abused: Not on file  . Physically Abused: Not on file  . Sexually Abused: Not on file    Review of Systems  See HPI.  PHYSICAL EXAMINATION:    LMP 04/01/2019 (Exact Date)     General appearance: alert, cooperative and appears stated age  ASSESSMENT  ASCUS pap and positive HR HPV.  OCP surveillance.  Dysmenorrhea.  PLAN  We had a comprehensive discussion regarding HPV, abnormal paps, colposcopy, and LEEP procedure.  I recommend proceeding with colposcopy.  Gardasil vaccine recommended. New prescription for her birth control sent to the pharmacy with clarification on continuous usage.   An After Visit Summary was printed and given to the patient.  _18_____ minutes face to face time of which over 50% was spent in counseling.

## 2019-04-23 ENCOUNTER — Encounter: Payer: Self-pay | Admitting: Obstetrics and Gynecology

## 2019-04-24 ENCOUNTER — Telehealth: Payer: Self-pay | Admitting: Obstetrics and Gynecology

## 2019-04-24 NOTE — Telephone Encounter (Signed)
Patient would like refill on tramadol. Unsure if it has been filled by Dr. Quincy Simmonds before. States it works well for painful periods and she would like to have it in case she has a cycle this month. If not tramadol, something similar. Pharmacy on file.

## 2019-04-24 NOTE — Telephone Encounter (Signed)
Patient was seen for AEX on 04/16/19 and Virtual visit on 04/22/19.  Tramdol not prescribed by Dr. Quincy Simmonds  Routing request to Dr. Quincy Simmonds to review and advise.

## 2019-04-24 NOTE — Telephone Encounter (Signed)
Would it be ok to discuss this further with the patient at her colposcopy visit? If her pain is still that significant, I would recommend switching to another treatment to control it instead of taking the birth control pills.

## 2019-04-25 ENCOUNTER — Telehealth: Payer: Self-pay | Admitting: Obstetrics and Gynecology

## 2019-04-25 NOTE — Telephone Encounter (Signed)
Call placed to convey benefits for colposcopy. °

## 2019-04-25 NOTE — Telephone Encounter (Signed)
Call placed to convey benefits for colposcopy. Spoke with patient and conveyed the benefits. Patient understands/agreeable with the benefits. Patient aware of the cancellation policy. Appointment scheduled 05/01/19.

## 2019-04-25 NOTE — Telephone Encounter (Signed)
Spoke with patient. Advised per Dr. Quincy Simmonds, patient agreeable to plan. OCP for contraceptive. Patient reports menses are irregular, is unsure of LMP. Advised per review of 04/16/19 OV LMP reported as 04/01/19 "exact date", patient states "I guess".   Advised patient colpo should not be scheduled while on menses, once scheduled if menses starts day before procedure you will need to return call to office to reschedule. Advised patient if any chance of pregnancy return call to office to notify. Patient request to schedule colpo. Colpo scheduled for 2/17 at 11:30am. Advised patient to take Motrin 800 mg with food and water one hour before procedure. Patient verbalizes understanding.   Routing to provider for final review. Patient is agreeable to disposition. Will close encounter.  Cc: Magdalene Patricia, Hayley Carder

## 2019-04-30 ENCOUNTER — Other Ambulatory Visit: Payer: Self-pay

## 2019-04-30 NOTE — Progress Notes (Signed)
  Subjective:     Patient ID: Valerie Peters, female   DOB: 1990-10-25, 29 y.o.   MRN: [id number]  HPI   Patient here today for colposcopy with pap 04-16-19 ASCUS:Pos HR HPV.  Pap history--pap had colposcopy age 68 but no treatment.  She stopped taking the Zoloft.  She was forgetting it.  Took 5 doses and did not feel different, so she stopped it.   Taking her birth control consistently and continuously. Spotting last week, but that stopped. She has used Tramadol in the past.   Will start Gardasil today.  Review of Systems  All other systems reviewed and are negative.   LMP: 04-01-19 Contraception: Continuous OCPs UPT: Neg     Objective:   Physical Exam Genitourinary:     Colposcopy - cervix, vagina. Consent for procedure.  3% acetic acid used in vagina. White light and green light filter used.  Colposcopy satisfactory:  Yes   __x__          No    _____ Findings:    Cervix:  Acetowhite change 9:00. Vagina: No lesions.  Biopsies:  ECC, biopsy of cervix at 9:00. Monsel's placed.  Minimal EBL. No complications.      Assessment:     ASCUS, positive HR HPV.  Dysmenorrhea.  PMS.  Emotional changes.      Plan:     FU biopsy results.  Post colposcopy precautions to patient.  Gardasil #1 today.  Start Zoloft again.  I explained that she needs at least 6 weeks to feel a difference.  FU in 2 months for next Gardasil and Zoloft recheck.

## 2019-05-01 ENCOUNTER — Other Ambulatory Visit (HOSPITAL_COMMUNITY)
Admission: RE | Admit: 2019-05-01 | Discharge: 2019-05-01 | Disposition: A | Discharge status: Home / Self Care | Payer: Medicaid Other | Source: Ambulatory Visit | Attending: Obstetrics and Gynecology | Admitting: Obstetrics and Gynecology

## 2019-05-01 ENCOUNTER — Encounter: Payer: Self-pay | Admitting: Obstetrics and Gynecology

## 2019-05-01 ENCOUNTER — Ambulatory Visit (INDEPENDENT_AMBULATORY_CARE_PROVIDER_SITE_OTHER): Payer: BC Managed Care – PPO | Admitting: Obstetrics and Gynecology

## 2019-05-01 VITALS — BP 104/60 | HR 80 | Temp 96.9°F | Ht 67.0 in | Wt 143.6 lb

## 2019-05-01 DIAGNOSIS — R87810 Cervical high risk human papillomavirus (HPV) DNA test positive: Secondary | ICD-10-CM | POA: Insufficient documentation

## 2019-05-01 DIAGNOSIS — Z23 Encounter for immunization: Secondary | ICD-10-CM

## 2019-05-01 DIAGNOSIS — N870 Mild cervical dysplasia: Secondary | ICD-10-CM | POA: Insufficient documentation

## 2019-05-01 DIAGNOSIS — Z01812 Encounter for preprocedural laboratory examination: Secondary | ICD-10-CM | POA: Diagnosis not present

## 2019-05-01 DIAGNOSIS — R87610 Atypical squamous cells of undetermined significance on cytologic smear of cervix (ASC-US): Secondary | ICD-10-CM

## 2019-05-01 LAB — POCT URINE PREGNANCY: Preg Test, Ur: NEGATIVE

## 2019-05-01 NOTE — Patient Instructions (Signed)
Colposcopy, Care After This sheet gives you information about how to care for yourself after your procedure. Your doctor may also give you more specific instructions. If you have problems or questions, contact your doctor. What can I expect after the procedure? If you did not have a tissue sample removed (did not have a biopsy), you may only have some spotting for a few days. You can go back to your normal activities. If you had a tissue sample removed, it is common to have:  Soreness and pain. This may last for a few days.  Light-headedness.  Mild bleeding from your vagina or dark-colored, grainy discharge from your vagina. This may last for a few days. You may need to wear a sanitary pad.  Spotting for at least 48 hours after the procedure. Follow these instructions at home:   Take over-the-counter and prescription medicines only as told by your doctor. Ask your doctor what medicines you can start taking again. This is very important if you take blood-thinning medicine.  Do not drive or use heavy machinery while taking prescription pain medicine.  For 3 days, or as long as your doctor tells you, avoid: ? Douching. ? Using tampons. ? Having sex.  If you use birth control (contraception), keep using it.  Limit activity for the first day after the procedure. Ask your doctor what activities are safe for you.  It is up to you to get the results of your procedure. Ask your doctor when your results will be ready.  Keep all follow-up visits as told by your doctor. This is important. Contact a doctor if:  You get a skin rash. Get help right away if:  You are bleeding a lot from your vagina. It is a lot of bleeding if you are using more than one pad an hour for 2 hours in a row.  You have clumps of blood (blood clots) coming from your vagina.  You have a fever.  You have chills  You have pain in your lower belly (pelvic area).  You have signs of infection, such as vaginal  discharge that is: ? Different than usual. ? Yellow. ? Bad-smelling.  You have very pain or cramps in your lower belly that do not get better with medicine.  You feel light-headed.  You feel dizzy.  You pass out (faint). Summary  If you did not have a tissue sample removed (did not have a biopsy), you may only have some spotting for a few days. You can go back to your normal activities.  If you had a tissue sample removed, it is common to have mild pain and spotting for 48 hours.  For 3 days, or as long as your doctor tells you, avoid douching, using tampons and having sex.  Get help right away if you have bleeding, very bad pain, or signs of infection. This information is not intended to replace advice given to you by your health care provider. Make sure you discuss any questions you have with your health care provider. Document Revised: 02/10/2017 Document Reviewed: 11/18/2015 Elsevier Patient Education  2020 Elsevier Inc.  

## 2019-05-03 LAB — SURGICAL PATHOLOGY

## 2019-05-05 ENCOUNTER — Encounter: Payer: Self-pay | Admitting: Obstetrics and Gynecology

## 2019-05-07 ENCOUNTER — Telehealth: Payer: Self-pay | Admitting: Obstetrics and Gynecology

## 2019-05-07 NOTE — Telephone Encounter (Signed)
Patient left message that she is returning a call.

## 2019-05-07 NOTE — Telephone Encounter (Signed)
Spoke to pt. Pt given results of pap results and recheck in 1 year. Pt agreeable.   Encounter closed.

## 2019-05-08 ENCOUNTER — Other Ambulatory Visit: Payer: Self-pay | Admitting: Obstetrics and Gynecology

## 2019-05-08 NOTE — Telephone Encounter (Signed)
Medication refill request: Zoloft  Last AEX:  04-16-19 BS Next OV for Zoloft follow up: 05-29-19 Last MMG (if hormonal medication request): n/a Refill authorized: Today, please advise.   Pharmacy requesting 90 day supply. Medication pended for #90, 0RF. Please refill if appropriate.

## 2019-05-10 ENCOUNTER — Other Ambulatory Visit: Payer: Self-pay | Admitting: Obstetrics and Gynecology

## 2019-05-10 DIAGNOSIS — R7989 Other specified abnormal findings of blood chemistry: Secondary | ICD-10-CM

## 2019-05-11 IMAGING — US US PELVIS COMPLETE WITH TRANSVAGINAL
1 series · 15 of 25 positions shown · non-contrast
Comparison: None

CLINICAL DATA: Dysmenorrhea, debilitating pain with menses monthly,
heavy menses since her late teens; LMP 03/25/2018

EXAM:
TRANSABDOMINAL AND TRANSVAGINAL ULTRASOUND OF PELVIS
TECHNIQUE: Both transabdominal and transvaginal ultrasound examinations of the
pelvis were performed. Transabdominal technique was performed for
global imaging of the pelvis including uterus, ovaries, adnexal
regions, and pelvic cul-de-sac. It was necessary to proceed with
endovaginal exam following the transabdominal exam to visualize the
endometrial complex.

[Series 1: us pelvis complete with transvaginal · 15 of 99 slices shown]
[im 1/99]
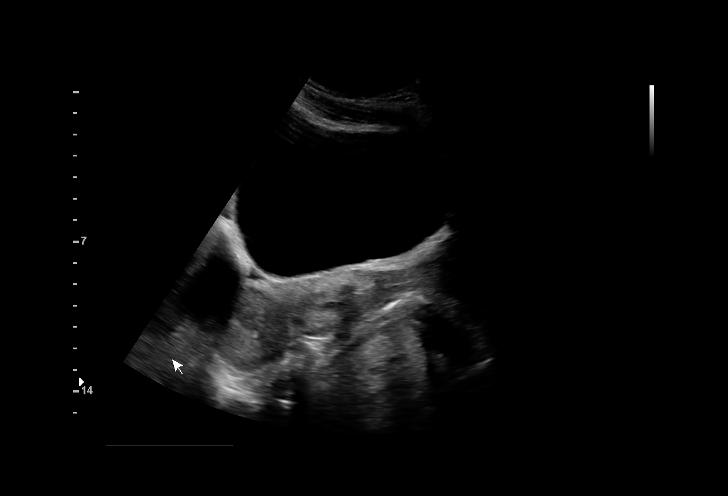
[im 9/99]
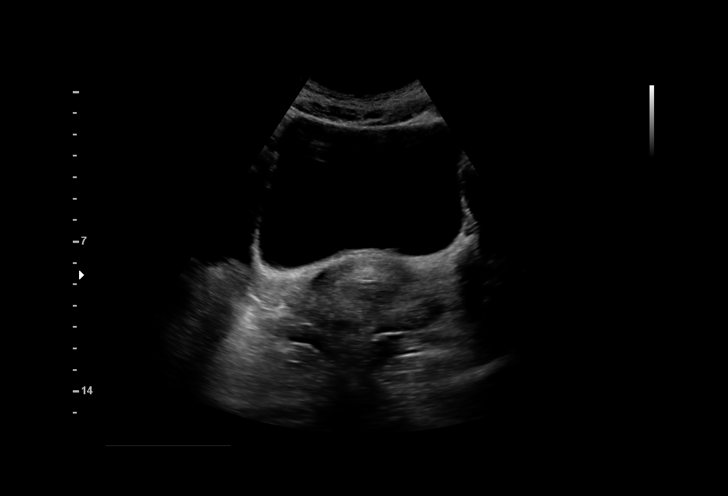
[im 17/99]
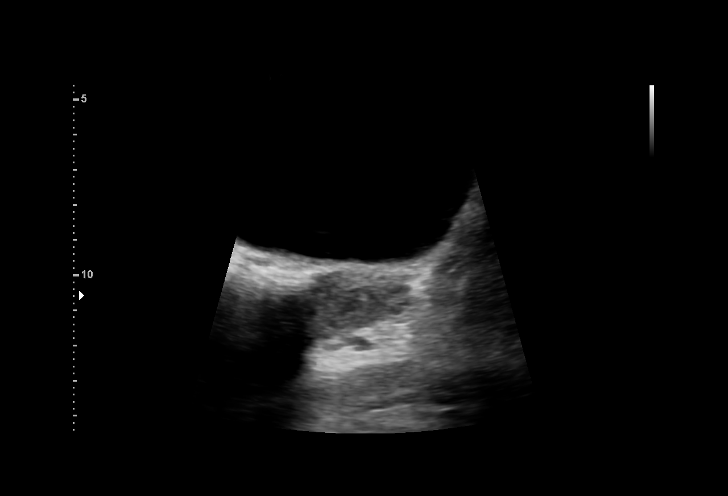
[im 21/99]
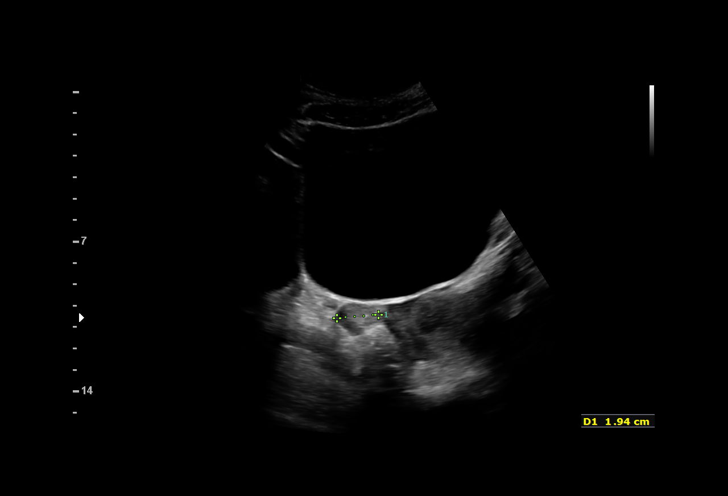
[im 29/99]
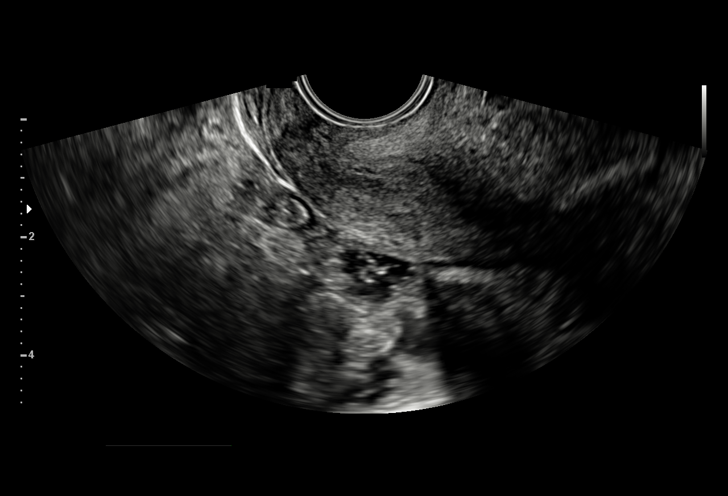
[im 37/99]
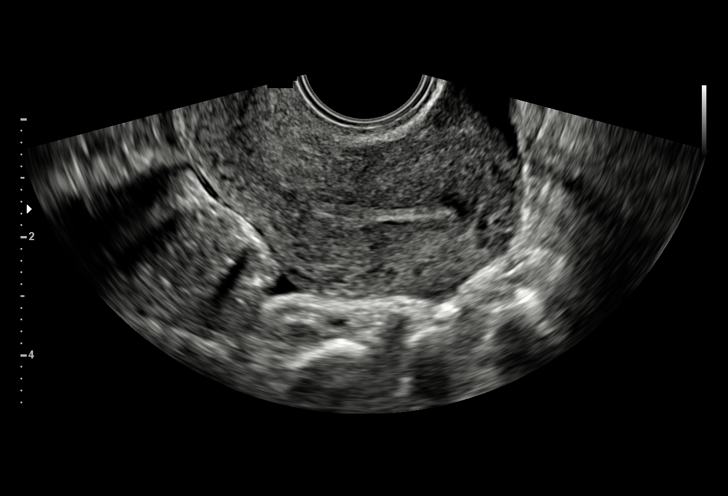
[im 41/99]
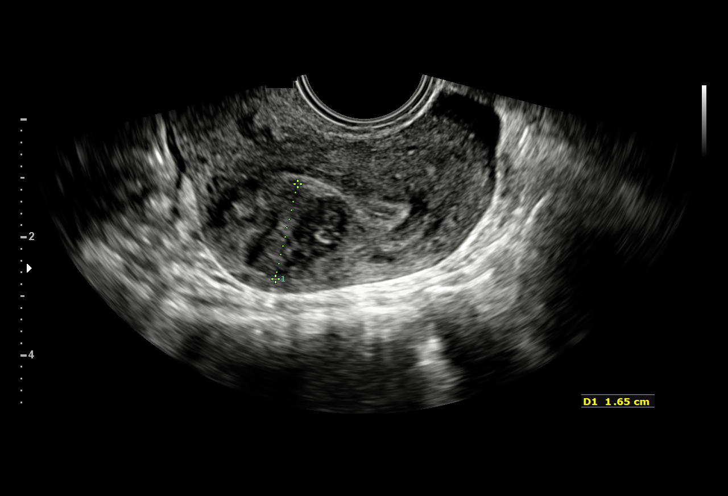
[im 50/99]
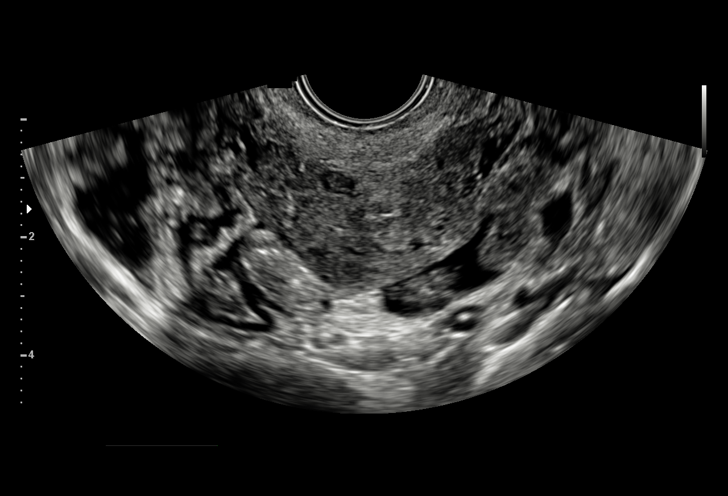
[im 58/99]
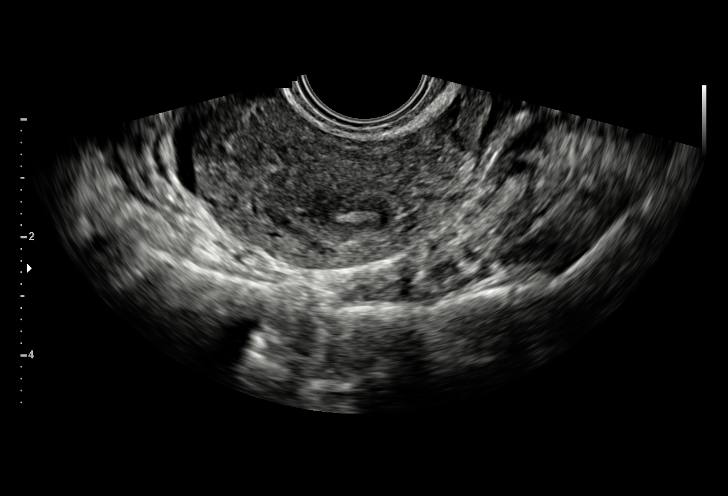
[im 62/99]
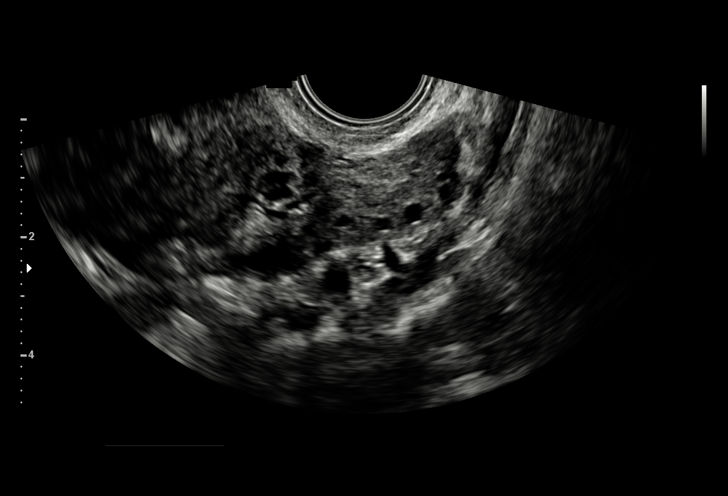
[im 70/99]
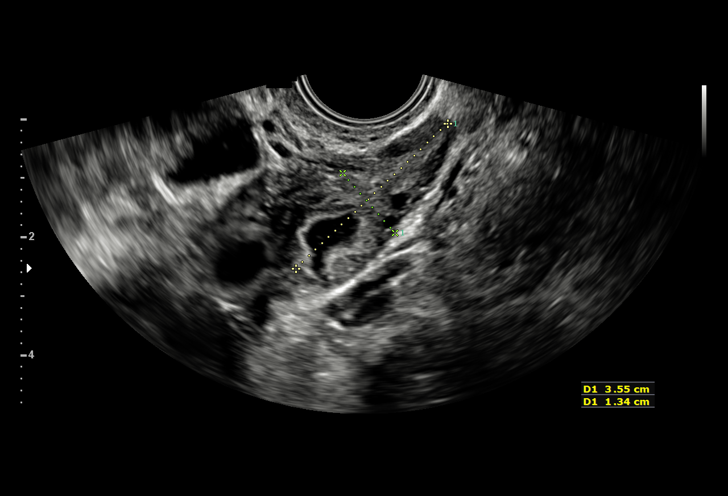
[im 78/99]
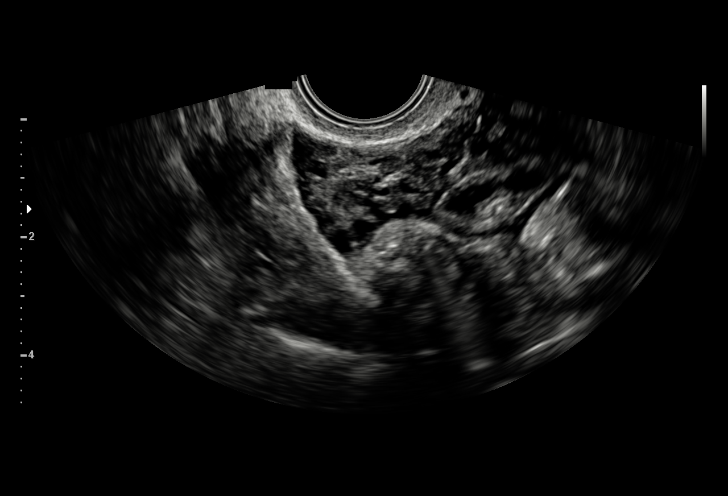
[im 82/99]
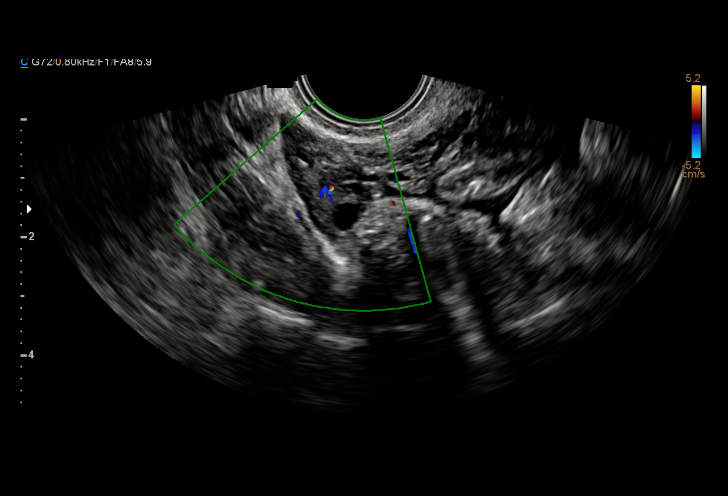
[im 90/99]
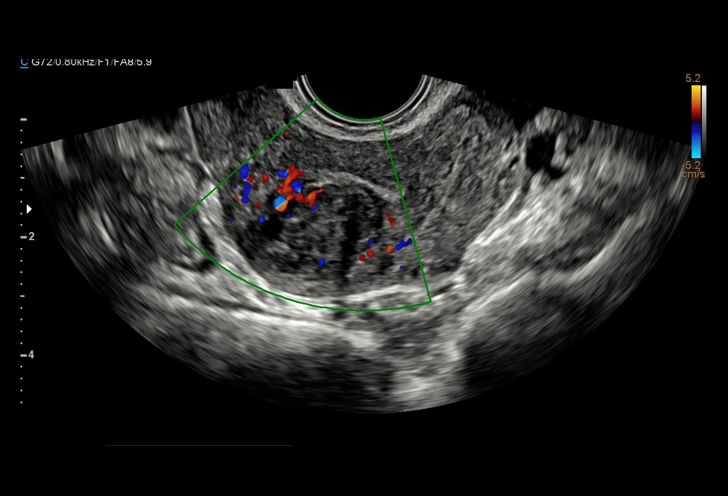
[im 99/99]
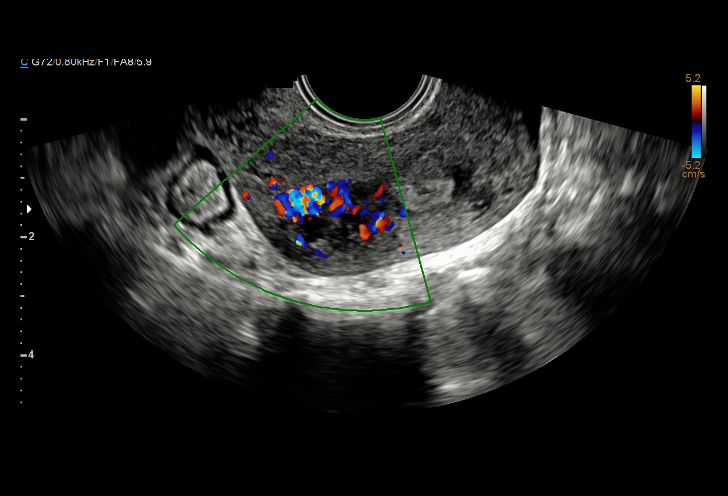

[15 of 25 positions shown; findings below may reference images not displayed]

FINDINGS: Uterus

Measurements: 7.4 x 3.0 x 4.9 cm = volume: 108.8 mL. Retroverted..
Anterior mid uterine leiomyoma 2.3 x 1.6 x 2.3 cm which extends
submucosal. Several additional tiny intramural leiomyomata are
noted, less than 1 cm in diameter.

Endometrium

Thickness: 6 mm.  No endometrial fluid or focal abnormality

Right ovary

Measurements: 2.3 x 1.2 x 1.5 cm = volume: 4.1 mL. Small nonspecific
calcification within RIGHT ovary 4 mm diameter. Normal morphology
without mass

Left ovary

Measurements: 3.6 x 1.2 x 2.7 cm = volume: 11.7 mL. Small collapsed
corpus luteum. Otherwise normal morphology without mass

Other findings

Small amount of nonspecific free pelvic fluid. No other adnexal
masses.
IMPRESSION: Submucosal leiomyoma within the anterior mid uterus 2.3 cm in
greatest size.

Remainder of exam shows no significant abnormalities.

## 2019-05-15 ENCOUNTER — Other Ambulatory Visit: Payer: Self-pay | Admitting: Obstetrics and Gynecology

## 2019-05-15 DIAGNOSIS — R7989 Other specified abnormal findings of blood chemistry: Secondary | ICD-10-CM

## 2019-05-16 NOTE — Telephone Encounter (Signed)
Please check on the status of this refill.  This should have already been received by the pharmacy.

## 2019-05-16 NOTE — Telephone Encounter (Signed)
Forwarding rx request

## 2019-05-23 ENCOUNTER — Telehealth: Payer: Self-pay | Admitting: Obstetrics and Gynecology

## 2019-05-23 ENCOUNTER — Encounter: Payer: Self-pay | Admitting: Obstetrics and Gynecology

## 2019-05-23 NOTE — Telephone Encounter (Signed)
I would like to recommend that Valerie Peters see a primary care provider or psychiatry to treat her anxiety and depression.  There are a lot of new antidepressant medications on the market that can be used to treat her symptoms that are different from the Zoloft.

## 2019-05-23 NOTE — Telephone Encounter (Signed)
Routing request to Dr. Quincy Simmonds to review and advise.

## 2019-05-23 NOTE — Telephone Encounter (Signed)
Valerie Peters, Valerie "Teira"  P [facility]  Phone Number: [phone]  Hello Dr. Quincy Simmonds,   The Zoloft prescription really makes my stomach hurt and is still causing me to feel completely nauseous and sick. Is there another option that won't cause all these symptoms? I've stopped taking the medication.   Thank you,   [name]

## 2019-05-23 NOTE — Telephone Encounter (Signed)
Call to patient, left detailed message, name identified on voicemail, ok per dpr. Advised as seen below per Dr. Quincy Simmonds. Advised patient to return call to office if she needs recommendations for providers.  Encounter closed.

## 2019-05-29 ENCOUNTER — Encounter: Payer: BC Managed Care – PPO | Admitting: Obstetrics and Gynecology

## 2019-05-29 ENCOUNTER — Other Ambulatory Visit: Payer: Self-pay

## 2019-05-29 NOTE — Progress Notes (Signed)
Appointment cancelled

## 2019-06-03 ENCOUNTER — Encounter: Payer: Self-pay | Admitting: Obstetrics and Gynecology

## 2019-06-04 ENCOUNTER — Other Ambulatory Visit: Payer: Self-pay | Admitting: Obstetrics and Gynecology

## 2019-06-04 ENCOUNTER — Telehealth: Payer: Self-pay | Admitting: Obstetrics and Gynecology

## 2019-06-04 DIAGNOSIS — N946 Dysmenorrhea, unspecified: Secondary | ICD-10-CM

## 2019-06-04 NOTE — Telephone Encounter (Signed)
Spoke to pt. Pt states has stopped taking Zoloft as of  05/23/2019. See phone encounter dated 05/23/19.  Pt is not aware of why she was needing it. Pt states made her sick on her stomach.  Zoloft removed from pts med list.  Routing to Dr Quincy Simmonds for update and Rx request.

## 2019-06-04 NOTE — Telephone Encounter (Signed)
Left message for pt to call and speak with Izaiha Lo, RN in triage.  

## 2019-06-04 NOTE — Telephone Encounter (Signed)
Non-Urgent Medical Question Received: Yesterday Message Contents  Liza, [place] "[name]" sent to [place]  Phone Number: [phone]  Hello Dr Quincy Simmonds,   Can you put a prescription in for a high dosage of ibuprofen. I'm experiencing some pain with my cycle.   Thank you,  [name]

## 2019-06-04 NOTE — Telephone Encounter (Signed)
Spoke to pt. Pt states wanting some pain relief meds like a Rx Ibuprofen to help with cramps during her cycle. Pt verified pharmacy.   Routing to Dr Quincy Simmonds for review. Rx pended for Ibuprofen # 30, 0RF. PRN.

## 2019-06-04 NOTE — Telephone Encounter (Signed)
Please contact patient in follow up to her request for Ibuprofen 800 mg for painful menses.  See refill request.  I need to understand if she is continuing with her Zoloft or not.  If she is not, please remove this from her medication list.   If she is taking Zoloft, she needs to know that Ibuprofen can interact with this and increase risk of gastrointestinal bleeding.  (Please also let me know how she is doing on the Zoloft.)  She can used limited ibuprofen if she is taking Zoloft.  If she adds the use of an acid blocking medication like Pepcid or over the counter Prilosec, this will reduce this risk.

## 2019-06-05 MED ORDER — IBUPROFEN 800 MG PO TABS: 800.0000 mg | ORAL_TABLET | Freq: Three times a day (TID) | ORAL | 1 refills | Status: DC | PRN

## 2019-06-05 NOTE — Telephone Encounter (Signed)
Spoke to pt. Pt given update on Rx Ibuprofen. Pt agreeable.   Encounter closed.

## 2019-06-05 NOTE — Telephone Encounter (Signed)
Patient was prescribed Zoloft for depression and PMS.  OK if she does not want to take this.   Rx for Motrin filled by me.  Ok to close the encounter.

## 2019-06-06 ENCOUNTER — Other Ambulatory Visit: Payer: Self-pay | Admitting: Obstetrics and Gynecology

## 2019-06-06 DIAGNOSIS — R7989 Other specified abnormal findings of blood chemistry: Secondary | ICD-10-CM

## 2019-06-14 ENCOUNTER — Other Ambulatory Visit: Payer: Self-pay | Admitting: Obstetrics and Gynecology

## 2019-06-14 DIAGNOSIS — R7989 Other specified abnormal findings of blood chemistry: Secondary | ICD-10-CM

## 2019-06-16 NOTE — Telephone Encounter (Signed)
Please contact patient.   She should already have a 3 month Rx and needs a lab recheck in 4 weeks to see her response to this.

## 2019-06-19 NOTE — Telephone Encounter (Signed)
Spoke with patient.  Patient is scheduled for Vit D recheck on 07/15/19.  Patient states pharmacy only dispensed 4 capsules of Vit D 50,000 IU. Advised patient 12 capsules were sent on 04/17/19, advised patient she should have 2 refills of 4 capsules [facility] at [facility], f/u with pharmacy for filling. Instructed patient to return call to office if additional assistance needed. Patient verbalizes understanding and is agreeable.   No rx sent.   Routing to provider for final review. Patient is agreeable to disposition. Will close encounter.

## 2019-06-28 ENCOUNTER — Other Ambulatory Visit: Payer: Self-pay

## 2019-07-01 ENCOUNTER — Ambulatory Visit (INDEPENDENT_AMBULATORY_CARE_PROVIDER_SITE_OTHER): Payer: BC Managed Care – PPO

## 2019-07-01 ENCOUNTER — Ambulatory Visit: Payer: BC Managed Care – PPO | Admitting: Obstetrics and Gynecology

## 2019-07-01 ENCOUNTER — Other Ambulatory Visit: Payer: Self-pay

## 2019-07-01 VITALS — BP 120/72 | HR 70 | Ht 67.0 in | Wt 150.0 lb

## 2019-07-01 DIAGNOSIS — Z23 Encounter for immunization: Secondary | ICD-10-CM

## 2019-07-01 NOTE — Progress Notes (Signed)
Patient in today for 2nd Gardasil injection.   Contraception: OCPs LMP: 06-03-19 Last AEX: 04-16-19 with Dr.Silva Injection given in Left deltoid. Patient tolerated shot well.   Patient informed next injection due in about 4 months.  Advised patient, if not on birth control, to return for next injection with cycle.   Routed to provider for final review.  Encounter closed.

## 2019-07-08 ENCOUNTER — Telehealth: Payer: Self-pay | Admitting: Obstetrics and Gynecology

## 2019-07-08 NOTE — Telephone Encounter (Signed)
Patient used a new bath product and is now itchy and asked if a prescription could be sent to her pharmacy?

## 2019-07-08 NOTE — Telephone Encounter (Signed)
AEX 04/16/19 Hx ASCUS pap with +HPV on 04/16/19 Hx Colpo with LGSIL 05/01/19 OCPs Pirmella 2nd Gardasil vaccine 07/01/19  Spoke with pt. Pt reports having itching x 3 days after using new bath product. Pt denies vaginal odor or discharge, fever.    Pt requesting Rx sent to pharmacy for possible yeast infection.  Pt advised to have OV for further evaluation. Pt declines appt at this time. Advised pt can take Monistat 3 or Monistat 7 for sx and  return call to office if sx don't improve for OV. Pt agreeable.   Routing to Dr Quincy Simmonds for review.  Encounter closed.

## 2019-07-11 ENCOUNTER — Other Ambulatory Visit: Payer: Self-pay | Admitting: Obstetrics and Gynecology

## 2019-07-11 DIAGNOSIS — R7989 Other specified abnormal findings of blood chemistry: Secondary | ICD-10-CM

## 2019-07-15 ENCOUNTER — Other Ambulatory Visit: Payer: Self-pay

## 2019-07-15 ENCOUNTER — Other Ambulatory Visit (INDEPENDENT_AMBULATORY_CARE_PROVIDER_SITE_OTHER): Payer: BC Managed Care – PPO

## 2019-07-15 DIAGNOSIS — R7989 Other specified abnormal findings of blood chemistry: Secondary | ICD-10-CM

## 2019-07-16 ENCOUNTER — Other Ambulatory Visit: Payer: Self-pay | Admitting: Obstetrics and Gynecology

## 2019-07-16 DIAGNOSIS — R7989 Other specified abnormal findings of blood chemistry: Secondary | ICD-10-CM

## 2019-07-16 LAB — VITAMIN D 25 HYDROXY (VIT D DEFICIENCY, FRACTURES): Vit D, 25-Hydroxy: 77.8 ng/mL (ref 30.0–100.0)

## 2019-08-22 ENCOUNTER — Telehealth: Payer: Self-pay

## 2019-08-22 NOTE — Telephone Encounter (Signed)
Patient called to discuss symptoms she is having with nurse.

## 2019-08-22 NOTE — Telephone Encounter (Signed)
AEX 04/16/2019  Spoke with pt. Pt states having continuous spotting x last 2 months. Pt states is "annoying" and having to wear a panty liner every day. Changing panty liner only 3-4 times a day. Denies heavy bleeding, clots, feeling weak or dizzy. Pt states being on same OCPs since 2020 and taking every day at same time.  Pt on Pirmella 1/35 mg.  Pt requesting something to stop the bleeding since going out of town this weekend with significant other. Pt advised to have OV to discuss bleeding and possibly change birth controls. Pt agreeable.  Pt scheduled OV with Dr Quincy Simmonds on 08/26/19 at 8:30 am. Pt verbalized understanding. CPS neg.   Routing to Dr Quincy Simmonds for review and recommendations.

## 2019-08-22 NOTE — Telephone Encounter (Signed)
Please have patient do a UPT.  If negative, she can take a birth control pill twice a day for the next 5 days, and then return to taking a pill once daily.  I will see her in follow up next week.

## 2019-08-22 NOTE — Telephone Encounter (Signed)
Spoke with pt. Pt given recommendations from Dr Quincy Simmonds. Pt agreeable to take UPT at home today and if negative will begin  taking 2 pills of OCPs for the next 5 days. Pt to call if positive pregnancy test or any further questions. Pt has scheduled follow up appt on 6/14 at 8:30 am. Routing to Dr Quincy Simmonds for review.  Encounter closed.

## 2019-08-23 ENCOUNTER — Other Ambulatory Visit: Payer: Self-pay

## 2019-08-26 ENCOUNTER — Ambulatory Visit (INDEPENDENT_AMBULATORY_CARE_PROVIDER_SITE_OTHER): Payer: BC Managed Care – PPO | Admitting: Obstetrics and Gynecology

## 2019-08-26 ENCOUNTER — Other Ambulatory Visit: Payer: Self-pay

## 2019-08-26 ENCOUNTER — Encounter: Payer: Self-pay | Admitting: Obstetrics and Gynecology

## 2019-08-26 VITALS — BP 110/60 | HR 76 | Temp 97.0°F | Ht 67.5 in | Wt 145.0 lb

## 2019-08-26 DIAGNOSIS — N946 Dysmenorrhea, unspecified: Secondary | ICD-10-CM | POA: Diagnosis not present

## 2019-08-26 DIAGNOSIS — Z3009 Encounter for other general counseling and advice on contraception: Secondary | ICD-10-CM

## 2019-08-26 DIAGNOSIS — N926 Irregular menstruation, unspecified: Secondary | ICD-10-CM

## 2019-08-26 DIAGNOSIS — D219 Benign neoplasm of connective and other soft tissue, unspecified: Secondary | ICD-10-CM | POA: Diagnosis not present

## 2019-08-26 LAB — POCT URINE PREGNANCY: Preg Test, Ur: NEGATIVE

## 2019-08-26 MED ORDER — IBUPROFEN 800 MG PO TABS: 800.0000 mg | ORAL_TABLET | Freq: Three times a day (TID) | ORAL | 1 refills | Status: DC | PRN

## 2019-08-26 MED ORDER — NORELGESTROMIN-ETH ESTRADIOL 150-35 MCG/24HR TD PTWK: 1.0000 | MEDICATED_PATCH | TRANSDERMAL | 3 refills | Status: DC

## 2019-08-26 NOTE — Progress Notes (Signed)
GYNECOLOGY  VISIT   HPI: 29 y.o.   Single  African American  female   [place] with Patient's last menstrual period was 08/18/2019.   here for spotting with OCP.   States she has had been bleeding for one to two months.  Spotting every day.  Sometimes has pain. Taking periodic Ibuprofen because she is expecting her period.   Does take her pills some hours late at times.  She is not taking the reminder pills and is doing continuous contraception.   She has uterine fibroids, one that is 2.3 cm anterior mid uterine and partially submucosal, per pelvic US Jan 2020.    No change in partner.   Having some mood change.  Zoloft caused her to feel sick.   Considering pregnancy in the future but not near future.   UPT - negative.   GYNECOLOGIC HISTORY: Patient's last menstrual period was 08/18/2019. Contraception:  OCP Menopausal hormone therapy:  n/a Last mammogram:  n/a Last pap smear:   04/16/19 ASCUS:Pos HR HPV.  Colpo 05/01/19 - LGSIL.        OB History    Gravida  0   Para  0   Term  0   Preterm  0   AB  0   Living  0     SAB  0   TAB  0   Ectopic  0   Multiple  0   Live Births  0              Patient Active Problem List   Diagnosis Date Noted  . Fibroids, submucosal 05/03/2018  . Menorrhagia with regular cycle 05/03/2018    Past Medical History:  Diagnosis Date  . Abnormal uterine bleeding   . Anxiety   . CIN I (cervical intraepithelial neoplasia I) 2021  . Depression   . Dysmenorrhea   . Heart murmur   . HSV-1 (herpes simplex virus 1) infection     Past Surgical History:  Procedure Laterality Date  . NO PAST SURGERIES      Current Outpatient Medications  Medication Sig Dispense Refill  . ibuprofen (ADVIL) 800 MG tablet Take 1 tablet (800 mg total) by mouth every 8 (eight) hours as needed. 30 tablet 1  . norethindrone-ethinyl estradiol 1/35 ([place] 1/35) tablet Take 1 tablet by mouth daily. Take continuously for 9 weeks and then take  reminder pills for one week to have your menstrual period. 3 Package 3  . ondansetron (ZOFRAN ODT) 4 MG disintegrating tablet Take 1 tablet (4 mg total) by mouth every 8 (eight) hours as needed for nausea or vomiting. 8 tablet 0  . Vitamin D, Ergocalciferol, (DRISDOL) 1.25 MG (50000 UNIT) CAPS capsule Take 1 capsule (50,000 Units total) by mouth every 7 (seven) days. (Patient not taking: Reported on 08/26/2019) 12 capsule 0   No current facility-administered medications for this visit.     ALLERGIES: Lactose intolerance (gi)  Family History  Problem Relation Age of Onset  . Hypertension Father     Social History   Socioeconomic History  . Marital status: Single    Spouse name: Not on file  . Number of children: Not on file  . Years of education: Not on file  . Highest education level: Not on file  Occupational History  . Not on file  Tobacco Use  . Smoking status: Never Smoker  . Smokeless tobacco: Never Used  Vaping Use  . Vaping Use: Never used  Substance and Sexual Activity  .  Alcohol use: Yes    Comment: occ glass of wine  . Drug use: Never  . Sexual activity: Yes    Birth control/protection: Pill    Comment: Orthotricyclen Lo  Other Topics Concern  . Not on file  Social History Narrative  . Not on file   Social Determinants of Health   Financial Resource Strain:   . Difficulty of Paying Living Expenses:   Food Insecurity:   . Worried About Charity fundraiser in the Last Year:   . Arboriculturist in the Last Year:   Transportation Needs:   . Film/video editor (Medical):   Marland Kitchen Lack of Transportation (Non-Medical):   Physical Activity:   . Days of Exercise per Week:   . Minutes of Exercise per Session:   Stress:   . Feeling of Stress :   Social Connections:   . Frequency of Communication with Friends and Family:   . Frequency of Social Gatherings with Friends and Family:   . Attends Religious Services:   . Active Member of Clubs or Organizations:   .  Attends Archivist Meetings:   Marland Kitchen Marital Status:   Intimate Partner Violence:   . Fear of Current or Ex-Partner:   . Emotionally Abused:   Marland Kitchen Physically Abused:   . Sexually Abused:     Review of Systems  Constitutional: Negative.   HENT: Negative.   Eyes: Negative.   Respiratory: Negative.   Cardiovascular: Negative.   Gastrointestinal: Negative.   Endocrine: Negative.   Genitourinary: Positive for vaginal bleeding.  Musculoskeletal: Negative.   Skin: Negative.   Allergic/Immunologic: Negative.   Neurological: Negative.   Hematological: Negative.   Psychiatric/Behavioral: Negative.     PHYSICAL EXAMINATION:    BP 110/60 (BP Location: Right Arm, Patient Position: Sitting, Cuff Size: Normal)   Pulse 76   Temp (!) 97 F (36.1 C) (Temporal)   Ht 5' 7.5" (1.715 m)   Wt 145 lb (65.8 kg)   LMP 08/18/2019 Comment: spotting   BMI 22.38 kg/m     General appearance: alert, cooperative and appears stated age  Pelvic: External genitalia:  no lesions              Urethra:  normal appearing urethra with no masses, tenderness or lesions              Bartholins and Skenes: normal                 Vagina: normal appearing vagina with normal color and discharge, no lesions              Cervix: no lesions                Bimanual Exam:  Uterus:  normal size, contour, position, consistency, mobility, non-tender              Adnexa: no mass, fullness, tenderness              Chaperone was present for exam.  ASSESSMENT    Irregular bleeding on continuous COCs.  Some late pills.  Uterine fibroids.   PLAN  Stop birth control pills.  Start [place], which she will do continuously.  Instructed in use.  If spotting continues, will do pelvic US. Start PNV for her routine vitamin.  Establish care with PCP.  List given.  Fu annual exam and prn.    An After Visit Summary was printed and given to the patient.  __33____ minutes consultation.

## 2019-10-31 ENCOUNTER — Ambulatory Visit (INDEPENDENT_AMBULATORY_CARE_PROVIDER_SITE_OTHER): Payer: BC Managed Care – PPO

## 2019-10-31 ENCOUNTER — Other Ambulatory Visit: Payer: Self-pay

## 2019-10-31 DIAGNOSIS — Z23 Encounter for immunization: Secondary | ICD-10-CM | POA: Diagnosis not present

## 2019-10-31 MED ORDER — HPV 9-VALENT RECOMB VACCINE IM SUSP: 0.5000 mL | Freq: Once | INTRAMUSCULAR | 0 refills | Status: DC

## 2019-10-31 NOTE — Progress Notes (Signed)
Patient in today for her 3rd Gardasil injection.   Contraception: OCP LMP: 10/24/19 Last AEX: 04/16/19  with dr Quincy Simmonds  Injection given in Lt deltoid. Patient tolerated shot well.   Advised patient, if not on birth control, to return for next injection with cycle.   Routed to provider for final review.  Encounter closed.

## 2019-11-22 ENCOUNTER — Encounter: Payer: Self-pay | Admitting: Obstetrics and Gynecology

## 2019-11-25 ENCOUNTER — Other Ambulatory Visit: Payer: Self-pay

## 2019-11-25 DIAGNOSIS — N946 Dysmenorrhea, unspecified: Secondary | ICD-10-CM

## 2019-11-25 MED ORDER — IBUPROFEN 800 MG PO TABS: 800.0000 mg | ORAL_TABLET | Freq: Three times a day (TID) | ORAL | 4 refills | Status: DC | PRN

## 2019-11-25 NOTE — Telephone Encounter (Signed)
Spoke with pt. Pt states needing new Rx for Ortho Evra patches and Rx Ibuprofen.  Pt states picked up last pack on 11/23/19. Pt states liking patches and doing well. Having regular flow monthly cycles. Feeling much better than on COCs.  Pt requesting refill on both Rx Ortho Evra and Ibuprofen. Would like 3 month supply until next AEX.  AEX scheduled for 04/2020 with Dr Quincy Simmonds.  Pharmacy verified.   [facility] 08/2019 # 9 patches, with 3 RF. [facility] with [facility] L[facility] at [facility], [facility] will not allow 3 month supply with Ortho Evra Rx. Pt has refills at pharmacy for Ortho Evra until 04/2020. Will return call to pt and let her know. Pt does not have Ibuprofen Rx refills.  Pt made aware of Rx refills of Ortho Evra. Pt verbalized understanding.   Rx Ibuprofen pended if approved. # 30, 4 RF until AEX?   Routing to Dr Quincy Simmonds.

## 2019-11-25 NOTE — Telephone Encounter (Signed)
Pt sent following mychart message:  [name], [name]  [facility] Hello,   I was wondering if I can recieve a refill of both my birth control patch and Ibuprofen as well.   Thank you!   Kindly,   Valerie Peters [employer]

## 2019-12-09 ENCOUNTER — Emergency Department (HOSPITAL_COMMUNITY)
Admission: EM | Admit: 2019-12-09 | Discharge: 2019-12-10 | Disposition: A | Discharge status: Home / Self Care | Payer: BC Managed Care – PPO | Attending: Emergency Medicine | Admitting: Emergency Medicine

## 2019-12-09 ENCOUNTER — Other Ambulatory Visit: Payer: Self-pay

## 2019-12-09 ENCOUNTER — Encounter (HOSPITAL_COMMUNITY): Payer: Self-pay

## 2019-12-09 DIAGNOSIS — Z5321 Procedure and treatment not carried out due to patient leaving prior to being seen by health care provider: Secondary | ICD-10-CM | POA: Diagnosis not present

## 2019-12-09 DIAGNOSIS — R0789 Other chest pain: Secondary | ICD-10-CM | POA: Diagnosis not present

## 2019-12-09 DIAGNOSIS — F419 Anxiety disorder, unspecified: Secondary | ICD-10-CM | POA: Insufficient documentation

## 2019-12-09 MED ORDER — HYDROXYZINE HCL 25 MG PO TABS: 25.0000 mg | ORAL_TABLET | Freq: Once | ORAL | Status: DC

## 2019-12-09 NOTE — ED Triage Notes (Addendum)
Pt arrived via walk in, states she is very anxious, was having a lot of anxiety at work, and started developing some chest tightness and feeling clammy and hot. States she has been having a lot of disagreements and stress at work.

## 2019-12-09 NOTE — ED Notes (Signed)
Pt left after triage

## 2019-12-24 ENCOUNTER — Other Ambulatory Visit: Payer: Self-pay

## 2019-12-24 DIAGNOSIS — Z3009 Encounter for other general counseling and advice on contraception: Secondary | ICD-10-CM

## 2019-12-24 MED ORDER — NORELGESTROMIN-ETH ESTRADIOL 150-35 MCG/24HR TD PTWK: 1.0000 | MEDICATED_PATCH | TRANSDERMAL | 1 refills | Status: DC

## 2019-12-24 NOTE — Telephone Encounter (Signed)
Medication refill request: Ortho evra patch  Last AEX:  04/16/19 Next AEX: 04/16/20 Last MMG (if hormonal medication request): NA Refill authorized: 9/3    Refilled in June with additional refills - however pt requesting mail order pharmacy to now be used.

## 2020-01-20 ENCOUNTER — Encounter: Payer: Self-pay | Admitting: Obstetrics and Gynecology

## 2020-04-16 ENCOUNTER — Ambulatory Visit: Payer: BC Managed Care – PPO | Admitting: Obstetrics and Gynecology

## 2020-06-14 ENCOUNTER — Encounter: Payer: Self-pay | Admitting: Obstetrics and Gynecology

## 2020-06-29 ENCOUNTER — Encounter (HOSPITAL_COMMUNITY): Payer: Self-pay | Admitting: Emergency Medicine

## 2020-06-29 ENCOUNTER — Other Ambulatory Visit: Payer: Self-pay

## 2020-06-29 ENCOUNTER — Ambulatory Visit (HOSPITAL_COMMUNITY): Payer: Self-pay

## 2020-06-29 ENCOUNTER — Ambulatory Visit (HOSPITAL_COMMUNITY)
Admission: EM | Admit: 2020-06-29 | Discharge: 2020-06-29 | Disposition: A | Discharge status: Home / Self Care | Payer: BC Managed Care – PPO | Attending: Physician Assistant | Admitting: Physician Assistant

## 2020-06-29 DIAGNOSIS — N898 Other specified noninflammatory disorders of vagina: Secondary | ICD-10-CM | POA: Diagnosis present

## 2020-06-29 DIAGNOSIS — Z113 Encounter for screening for infections with a predominantly sexual mode of transmission: Secondary | ICD-10-CM | POA: Insufficient documentation

## 2020-06-29 LAB — HIV ANTIBODY (ROUTINE TESTING W REFLEX): HIV Screen 4th Generation wRfx: NONREACTIVE

## 2020-06-29 LAB — POC URINE PREG, ED: Preg Test, Ur: NEGATIVE

## 2020-06-29 MED ORDER — FLUCONAZOLE 150 MG PO TABS: 150.0000 mg | ORAL_TABLET | Freq: Every day | ORAL | 0 refills | Status: DC

## 2020-06-29 MED ORDER — METRONIDAZOLE 500 MG PO TABS: 500.0000 mg | ORAL_TABLET | Freq: Two times a day (BID) | ORAL | 0 refills | Status: DC

## 2020-06-29 NOTE — Discharge Instructions (Addendum)
We are going to treat for bacterial vaginosis based on everything we discussed.  If there is anything else you need to be treated for we will contact you.  Please not drink any alcohol while taking metronidazole and for 72 hours after completing course this will cause you to vomit.  If you have any worsening symptoms please return for further evaluation.

## 2020-06-29 NOTE — ED Provider Notes (Addendum)
[place]    CSN: [id number] Arrival date & time: 06/29/20  1732      History   Chief Complaint Chief Complaint  Patient presents with  . Vaginal Discharge    HPI Valerie Peters is a 30 y.o. female.   Patient presents today with a 1 week history of vaginal discharge.  She describes this as copious white/clear with odor.  She does report new soap is unsure if this is contributing to symptoms.  She denies any changes to tampon brand, antibiotic use, medication changes.  She does report a new sexual partner but reports consistently using condoms.  She has a history of STI when she was 14 but reports being treated and having negative test of cure.  She denies additional STI since that time.  She has not tried any over-the-counter medications for symptom management.  She denies any abdominal pain, nausea, vomiting, pelvic pain, fever, vaginal pain, abnormal vaginal bleeding.  She has no concern for pregnancy as she is on her menstrual cycle and takes birth control but is open to testing today to confirm this.     Past Medical History:  Diagnosis Date  . Abnormal uterine bleeding   . Anxiety   . CIN I (cervical intraepithelial neoplasia I) 2021  . Depression   . Dysmenorrhea   . Heart murmur   . HSV-1 (herpes simplex virus 1) infection     Patient Active Problem List   Diagnosis Date Noted  . Fibroids, submucosal 05/03/2018  . Menorrhagia with regular cycle 05/03/2018    Past Surgical History:  Procedure Laterality Date  . NO PAST SURGERIES      OB History    Gravida  0   Para  0   Term  0   Preterm  0   AB  0   Living  0     SAB  0   IAB  0   Ectopic  0   Multiple  0   Live Births  0            Home Medications    Prior to Admission medications   Medication Sig Start Date End Date Taking? Authorizing Provider  metroNIDAZOLE (FLAGYL) 500 MG tablet Take 1 tablet (500 mg total) by mouth 2 (two) times daily. 06/29/20  Yes Sye Schroepfer K,  PA-C  ibuprofen (ADVIL) 800 MG tablet Take 1 tablet (800 mg total) by mouth every 8 (eight) hours as needed for cramping. 11/25/19   Nunzio Cobbs, MD  norelgestromin-ethinyl estradiol (ORTHO EVRA) 150-35 MCG/24HR transdermal patch Place 1 patch onto the skin once a week. You will switch you patch weekly so you do not have a menstruation. 12/24/19   Nunzio Cobbs, MD  ondansetron (ZOFRAN ODT) 4 MG disintegrating tablet Take 1 tablet (4 mg total) by mouth every 8 (eight) hours as needed for nausea or vomiting. 02/04/19   Recardo Evangelist, PA-C  Vitamin D, Ergocalciferol, (DRISDOL) 1.25 MG (50000 UNIT) CAPS capsule Take 1 capsule (50,000 Units total) by mouth every 7 (seven) days. Patient not taking: Reported on 08/26/2019 04/17/19   Nunzio Cobbs, MD    Family History Family History  Problem Relation Age of Onset  . Hypertension Father     Social History Social History   Tobacco Use  . Smoking status: Never Smoker  . Smokeless tobacco: Never Used  Vaping Use  . Vaping Use: Never used  Substance Use Topics  .  Alcohol use: Yes    Comment: occ glass of wine  . Drug use: Never     Allergies   Lactose intolerance (gi)   Review of Systems Review of Systems  Constitutional: Negative for activity change, appetite change, fatigue and fever.  Respiratory: Negative for cough and shortness of breath.   Cardiovascular: Negative for chest pain.  Gastrointestinal: Negative for abdominal pain, diarrhea, nausea and vomiting.  Genitourinary: Positive for vaginal discharge. Negative for dysuria, frequency, urgency, vaginal bleeding and vaginal pain.  Neurological: Negative for dizziness, light-headedness and headaches.     Physical Exam Triage Vital Signs ED Triage Vitals  Enc Vitals Group     BP 06/29/20 1828 118/85     Pulse Rate 06/29/20 1828 76     Resp 06/29/20 1828 16     Temp 06/29/20 1828 98.3 F (36.8 C)     Temp Source 06/29/20 1828 Oral      SpO2 06/29/20 1828 100 %     Weight --      Height --      Head Circumference --      Peak Flow --      Pain Score 06/29/20 1826 0     Pain Loc --      Pain Edu? --      Excl. in [place]? --    No data found.  Updated Vital Signs BP 118/85 (BP Location: Right Arm)   Pulse 76   Temp 98.3 F (36.8 C) (Oral)   Resp 16   LMP 06/26/2020 (Approximate)   SpO2 100%   Visual Acuity Right Eye Distance:   Left Eye Distance:   Bilateral Distance:    Right Eye Near:   Left Eye Near:    Bilateral Near:     Physical Exam Vitals reviewed.  Constitutional:      General: She is awake. She is not in acute distress.    Appearance: Normal appearance. She is not ill-appearing.     Comments: Very pleasant female appears stated age in no acute distress  HENT:     Head: Normocephalic and atraumatic.  Cardiovascular:     Rate and Rhythm: Normal rate and regular rhythm.     Heart sounds: No murmur heard.   Pulmonary:     Effort: Pulmonary effort is normal.     Breath sounds: Normal breath sounds. No wheezing, rhonchi or rales.     Comments: Clear to auscultation bilaterally Abdominal:     General: Bowel sounds are normal.     Palpations: Abdomen is soft.     Tenderness: There is no abdominal tenderness. There is no right CVA tenderness, left CVA tenderness, guarding or rebound.     Comments: Benign abdominal exam  Genitourinary:    Comments: Exam deferred Psychiatric:        Behavior: Behavior is cooperative.      UC Treatments / Results  Labs (all labs ordered are listed, but only abnormal results are displayed) Labs Reviewed  HIV ANTIBODY (ROUTINE TESTING W REFLEX)  HEPATITIS PANEL, ACUTE  RPR  POC URINE PREG, ED  CERVICOVAGINAL ANCILLARY ONLY    EKG   Radiology No results found.  Procedures Procedures (including critical care time)  Medications Ordered in UC Medications - No data to display  Initial Impression / Assessment and Plan / UC Course  I have reviewed  the triage vital signs and the nursing notes.  Pertinent labs & imaging results that were available during my care of the patient  were reviewed by me and considered in my medical decision making (see chart for details).     Urine pregnancy was negative in office today.  Will empirically treat for bacterial vaginosis with Flagyl 500 mg twice daily for 7 days.  Discussed that patient should not drink any alcohol while taking this medication and for 72 hours after completing course due to Antabuse side effects associated with this medication.  We will determine if additional treatment is necessary based on laboratory findings.  Strict return precautions given to which patient expressed understanding.  Addendum: At the end of visit patient reported that she often gets yeast infection with metronidazole and requested Diflucan.  1 pill of Diflucan sent to pharmacy as requested.  Final Clinical Impressions(s) / UC Diagnoses   Final diagnoses:  Vaginal discharge  Routine screening for STI (sexually transmitted infection)     Discharge Instructions     We are going to treat for bacterial vaginosis based on everything we discussed.  If there is anything else you need to be treated for we will contact you.  Please not drink any alcohol while taking metronidazole and for 72 hours after completing course this will cause you to vomit.  If you have any worsening symptoms please return for further evaluation.    ED Prescriptions    Medication Sig Dispense Auth. Provider   metroNIDAZOLE (FLAGYL) 500 MG tablet Take 1 tablet (500 mg total) by mouth 2 (two) times daily. 14 tablet Veretta Sabourin, Derry Skill, PA-C     PDMP not reviewed this encounter.   Terrilee Croak, PA-C 06/29/20 1912    Effa Yarrow, Derry Skill, PA-C 06/29/20 1915

## 2020-06-29 NOTE — ED Triage Notes (Signed)
Pt presents today with c/o of vaginal discharge (white) with an odor x 1 wk. She does report using protection. Denies pain or burning.

## 2020-06-30 LAB — CERVICOVAGINAL ANCILLARY ONLY
Bacterial Vaginitis (gardnerella): POSITIVE — AB
Candida Glabrata: NEGATIVE
Candida Vaginitis: NEGATIVE
Chlamydia: NEGATIVE
Comment: NEGATIVE
Comment: NEGATIVE
Comment: NEGATIVE
Comment: NEGATIVE
Comment: NEGATIVE
Comment: NORMAL
Neisseria Gonorrhea: NEGATIVE
Trichomonas: NEGATIVE

## 2020-06-30 LAB — HEPATITIS PANEL, ACUTE
HCV Ab: NONREACTIVE
Hep A IgM: NONREACTIVE
Hep B C IgM: NONREACTIVE
Hepatitis B Surface Ag: NONREACTIVE

## 2020-06-30 LAB — RPR: RPR Ser Ql: NONREACTIVE

## 2020-07-28 ENCOUNTER — Encounter: Payer: Self-pay | Admitting: Obstetrics and Gynecology

## 2020-07-29 ENCOUNTER — Ambulatory Visit (INDEPENDENT_AMBULATORY_CARE_PROVIDER_SITE_OTHER): Payer: BC Managed Care – PPO | Admitting: Obstetrics and Gynecology

## 2020-07-29 ENCOUNTER — Other Ambulatory Visit (HOSPITAL_COMMUNITY)
Admission: RE | Admit: 2020-07-29 | Discharge: 2020-07-29 | Disposition: A | Discharge status: Home / Self Care | Payer: BC Managed Care – PPO | Source: Ambulatory Visit | Attending: Obstetrics and Gynecology | Admitting: Obstetrics and Gynecology

## 2020-07-29 ENCOUNTER — Encounter: Payer: Self-pay | Admitting: Obstetrics and Gynecology

## 2020-07-29 ENCOUNTER — Other Ambulatory Visit: Payer: Self-pay

## 2020-07-29 VITALS — BP 126/76 | HR 76 | Resp 16 | Ht 67.5 in | Wt 135.0 lb

## 2020-07-29 DIAGNOSIS — Z01419 Encounter for gynecological examination (general) (routine) without abnormal findings: Secondary | ICD-10-CM

## 2020-07-29 DIAGNOSIS — N946 Dysmenorrhea, unspecified: Secondary | ICD-10-CM

## 2020-07-29 DIAGNOSIS — Z113 Encounter for screening for infections with a predominantly sexual mode of transmission: Secondary | ICD-10-CM

## 2020-07-29 DIAGNOSIS — N898 Other specified noninflammatory disorders of vagina: Secondary | ICD-10-CM | POA: Diagnosis present

## 2020-07-29 DIAGNOSIS — R7989 Other specified abnormal findings of blood chemistry: Secondary | ICD-10-CM

## 2020-07-29 MED ORDER — IBUPROFEN 800 MG PO TABS: 800.0000 mg | ORAL_TABLET | Freq: Three times a day (TID) | ORAL | 3 refills | Status: DC | PRN

## 2020-07-29 MED ORDER — NORELGESTROMIN-ETH ESTRADIOL 150-35 MCG/24HR TD PTWK: 1.0000 | MEDICATED_PATCH | TRANSDERMAL | 4 refills | Status: DC

## 2020-07-29 NOTE — Progress Notes (Signed)
30 y.o. G0P0000 Single African American female here for annual exam.    Patient complaining of recurrent BV--seen recently in ER and treated on 06/29/20. She currently has vaginal odor. Patient was treated with Flagyl and a course of Diflucan, which she took yesterday. Felt sick with Flagyl. She had negative GC/CT/trich/HIV, RPR, hep B, and Hep C testing.   She has had a new partner, which she is not longer with.  Last intercourse was the beginning of April.  She has been using a tea tree suppository vaginally to treat odor.   She uses Ibuprofen for cramping with her cycles.  Menstrual flow is heavy on first and second day.   Having acne off her birth control.  She liked the Ortho Evra.  Working for a new company.   PCP:   None.   Patient's last menstrual period was 07/22/2020 (exact date).     Period Cycle (Days): 30 Period Duration (Days): 5-7 days Period Pattern: Regular Menstrual Flow: Moderate Menstrual Control: Maxi pad Menstrual Control Change Freq (Hours): changes maxi pad every 2-3 hours on heaviest day Dysmenorrhea: (!) Mild Dysmenorrhea Symptoms: Cramping     Sexually active: Yes.    The current method of family planning is condoms everytime.    Exercising: Yes.    walks daily Smoker:  no  Health Maintenance: Pap: 04-16-19 ASCUS:Pos HR HPV, 02-07-18 Neg, 2017 Neg per patient History of abnormal Pap:  Yes, 05-01-19 colpo revealed LGSIL;Pap 04-16-19 ASCUS:Pos HR HPV--no treatment. Had colpo age 31 with no treatment. MMG:  n/a Colonoscopy:  n/a BMD:   n/a  Result  n/a TDaP:  Up to date Gardasil:   Yes, completed HIV: 06-29-20 NR Hep C: 06-29-20 Neg Screening Labs:  Today.   reports that she has never smoked. She has never used smokeless tobacco. She reports current alcohol use of about 2.0 standard drinks of alcohol per week. She reports that she does not use drugs.  Past Medical History:  Diagnosis Date  . Abnormal uterine bleeding   . Anxiety   . CIN I  (cervical intraepithelial neoplasia I) 2021  . Depression   . Dysmenorrhea   . Fibroid uterus   . Heart murmur   . HSV-1 (herpes simplex virus 1) infection     Past Surgical History:  Procedure Laterality Date  . NO PAST SURGERIES      Current Outpatient Medications  Medication Sig Dispense Refill  . norelgestromin-ethinyl estradiol (ORTHO EVRA) 150-35 MCG/24HR transdermal patch Place 1 patch onto the skin once a week. 9 patch 4  . ondansetron (ZOFRAN ODT) 4 MG disintegrating tablet Take 1 tablet (4 mg total) by mouth every 8 (eight) hours as needed for nausea or vomiting. 8 tablet 0  . ibuprofen (ADVIL) 800 MG tablet Take 1 tablet (800 mg total) by mouth every 8 (eight) hours as needed for cramping. 100 tablet 3   No current facility-administered medications for this visit.    Family History  Problem Relation Age of Onset  . Hypertension Father     Review of Systems  All other systems reviewed and are negative.   Exam:   BP 126/76   Pulse 76   Resp 16   Ht 5' 7.5" (1.715 m)   Wt 135 lb (61.2 kg)   LMP 07/22/2020 (Exact Date)   BMI 20.83 kg/m     General appearance: alert, cooperative and appears stated age Head: normocephalic, without obvious abnormality, atraumatic Neck: no adenopathy, supple, symmetrical, trachea midline and thyroid normal  to inspection and palpation Lungs: clear to auscultation bilaterally Breasts: normal appearance, no masses or tenderness, No nipple retraction or dimpling, No nipple discharge or bleeding, No axillary adenopathy Heart: regular rate and rhythm Abdomen: soft, non-tender; no masses, no organomegaly Extremities: extremities normal, atraumatic, no cyanosis or edema Skin: skin color, texture, turgor normal. No rashes or lesions Lymph nodes: cervical, supraclavicular, and axillary nodes normal. Neurologic: grossly normal  Pelvic: External genitalia:  no lesions              No abnormal inguinal nodes palpated.              Urethra:   normal appearing urethra with no masses, tenderness or lesions              Bartholins and Skenes: normal                 Vagina: normal appearing vagina with normal color and discharge, no lesions              Cervix: no lesions              Pap taken: Yes.   Bimanual Exam:  Uterus:  normal size, contour, position, consistency, mobility, non-tender              Adnexa: no mass, fullness, tenderness               Chaperone was present for exam.  Assessment:   Well woman visit with normal exam. Fibroids.  Partial submucous component.  Dysmenorrhea. Hx LGSIL.  Vaginal odor.  Hx BV. Hx low vit D. HSV I.   Plan: Mammogram screening discussed. Self breast awareness reviewed. Pap and HR HPV as above. Guidelines for Calcium, Vitamin D, regular exercise program including cardiovascular and weight bearing exercise. STD screening.  Vaginitis testing.  Routine labs.  Rx for OrthoEvra and Motrin.   Follow up annually and prn.

## 2020-07-29 NOTE — Patient Instructions (Signed)

## 2020-07-30 ENCOUNTER — Encounter: Payer: Self-pay | Admitting: Obstetrics and Gynecology

## 2020-07-30 LAB — CBC
HCT: 39.6 % (ref 35.0–45.0)
Hemoglobin: 13.1 g/dL (ref 11.7–15.5)
MCH: 33.8 pg — ABNORMAL HIGH (ref 27.0–33.0)
MCHC: 33.1 g/dL (ref 32.0–36.0)
MCV: 102.1 fL — ABNORMAL HIGH (ref 80.0–100.0)
MPV: 10.8 fL (ref 7.5–12.5)
Platelets: 238 10*3/uL (ref 140–400)
RBC: 3.88 10*6/uL (ref 3.80–5.10)
RDW: 11.3 % (ref 11.0–15.0)
WBC: 6.6 10*3/uL (ref 3.8–10.8)

## 2020-07-30 LAB — COMPREHENSIVE METABOLIC PANEL
AG Ratio: 1.9 (calc) (ref 1.0–2.5)
ALT: 24 U/L (ref 6–29)
AST: 22 U/L (ref 10–30)
Albumin: 4.6 g/dL (ref 3.6–5.1)
Alkaline phosphatase (APISO): 70 U/L (ref 31–125)
BUN: 11 mg/dL (ref 7–25)
CO2: 25 mmol/L (ref 20–32)
Calcium: 9.2 mg/dL (ref 8.6–10.2)
Chloride: 104 mmol/L (ref 98–110)
Creat: 0.78 mg/dL (ref 0.50–1.10)
Globulin: 2.4 g/dL (calc) (ref 1.9–3.7)
Glucose, Bld: 75 mg/dL (ref 65–99)
Potassium: 4 mmol/L (ref 3.5–5.3)
Sodium: 138 mmol/L (ref 135–146)
Total Bilirubin: 1.6 mg/dL — ABNORMAL HIGH (ref 0.2–1.2)
Total Protein: 7 g/dL (ref 6.1–8.1)

## 2020-07-30 LAB — HEPATITIS C ANTIBODY
Hepatitis C Ab: NONREACTIVE
SIGNAL TO CUT-OFF: 0.01 (ref <1.00)

## 2020-07-30 LAB — CERVICOVAGINAL ANCILLARY ONLY
Bacterial Vaginitis (gardnerella): POSITIVE — AB
Candida Glabrata: NEGATIVE
Candida Vaginitis: NEGATIVE
Chlamydia: NEGATIVE
Comment: NEGATIVE
Comment: NEGATIVE
Comment: NEGATIVE
Comment: NEGATIVE
Comment: NEGATIVE
Comment: NORMAL
Neisseria Gonorrhea: NEGATIVE
Trichomonas: NEGATIVE

## 2020-07-30 LAB — VITAMIN D 25 HYDROXY (VIT D DEFICIENCY, FRACTURES): Vit D, 25-Hydroxy: 20 ng/mL — ABNORMAL LOW (ref 30–100)

## 2020-07-30 LAB — LIPID PANEL
Cholesterol: 134 mg/dL (ref <200)
HDL: 51 mg/dL (ref >=50)
LDL Cholesterol (Calc): 70 mg/dL (calc)
Non-HDL Cholesterol (Calc): 83 mg/dL (calc) (ref <130)
Total CHOL/HDL Ratio: 2.6 (calc) (ref <5.0)
Triglycerides: 51 mg/dL (ref <150)

## 2020-07-30 LAB — HIV ANTIBODY (ROUTINE TESTING W REFLEX): HIV 1&2 Ab, 4th Generation: NONREACTIVE

## 2020-07-30 LAB — HEPATITIS B SURFACE ANTIGEN: Hepatitis B Surface Ag: NONREACTIVE

## 2020-07-30 LAB — RPR: RPR Ser Ql: NONREACTIVE

## 2020-07-31 ENCOUNTER — Encounter: Payer: Self-pay | Admitting: Obstetrics and Gynecology

## 2020-07-31 MED ORDER — CLINDAMYCIN PHOSPHATE 2 % VA CREA: 1.0000 | TOPICAL_CREAM | Freq: Every day | VAGINAL | 0 refills | Status: DC

## 2020-07-31 NOTE — Telephone Encounter (Signed)
Please see the Result Note with results and plan. Thank you.

## 2020-07-31 NOTE — Addendum Note (Signed)
Addended by: Yisroel Ramming, Dietrich Pates E on: 07/31/2020 09:41 AM   Modules accepted: Orders

## 2020-07-31 NOTE — Telephone Encounter (Signed)
Patient has seen 07/29/20 results.

## 2020-08-03 LAB — CYTOLOGY - PAP
Comment: NEGATIVE
High risk HPV: POSITIVE — AB

## 2020-08-04 ENCOUNTER — Other Ambulatory Visit: Payer: Self-pay | Admitting: *Deleted

## 2020-08-04 DIAGNOSIS — R87612 Low grade squamous intraepithelial lesion on cytologic smear of cervix (LGSIL): Secondary | ICD-10-CM

## 2020-09-01 NOTE — Progress Notes (Signed)
  Subjective:     Patient ID: Valerie Peters, female   DOB: October 20, 1990, 30 y.o.   MRN: [id number]  HPI Patient here today for colposcopy with pap smear 07-29-20 LGSIL:Pos HR HPV.  PAP HISTORY 07-29-20 LGSIL:Pos HR HPV 04-16-19 ASCUS:Pos HR HPV.  Colposcopy 05/01/19 - LGSIL.  02-07-18 Neg  Received her Gardasil vaccine.   Uses marijuana, not tobacco.    Review of Systems  All other systems reviewed and are negative. LMP:08-17-20 Contraception: condoms everytime UPT: Neg     Objective:   Physical Exam  Colposcopy of cervix and vagina. Consent for procedure.  3% acetic acid used.  Colposcopy satisfactory. Thickened acetowhite lesion at 6:00 - 7:00 of exocervix.  Acetowhite lesions just inside the cervical canal at 11:00 - 12:00.  Limits of lesion seen. Biopsies:  ECC, biopsy at 7:00, biopsy at 11:00.   All specimens to pathology separately.  Monsel's placed.  Minimal EBL.  No complications.     Assessment:     LGSIL pap.  Positive HR HPV.    Plan:     We discussed HPV, abnormal paps, colposcopy and LEEP procedure.  FU biopsy results.  Call for fever, heavy bleeding or significant pain.  Pap and HR HPV in May, 2023.

## 2020-09-02 ENCOUNTER — Other Ambulatory Visit (HOSPITAL_COMMUNITY)
Admission: RE | Admit: 2020-09-02 | Discharge: 2020-09-02 | Disposition: A | Discharge status: Home / Self Care | Payer: BC Managed Care – PPO | Source: Ambulatory Visit | Attending: Obstetrics and Gynecology | Admitting: Obstetrics and Gynecology

## 2020-09-02 ENCOUNTER — Ambulatory Visit (INDEPENDENT_AMBULATORY_CARE_PROVIDER_SITE_OTHER): Payer: BC Managed Care – PPO | Admitting: Obstetrics and Gynecology

## 2020-09-02 ENCOUNTER — Other Ambulatory Visit: Payer: Self-pay

## 2020-09-02 ENCOUNTER — Encounter: Payer: Self-pay | Admitting: Obstetrics and Gynecology

## 2020-09-02 VITALS — BP 124/70 | HR 94 | Ht 67.0 in | Wt 135.0 lb

## 2020-09-02 DIAGNOSIS — R87810 Cervical high risk human papillomavirus (HPV) DNA test positive: Secondary | ICD-10-CM | POA: Diagnosis not present

## 2020-09-02 DIAGNOSIS — R87612 Low grade squamous intraepithelial lesion on cytologic smear of cervix (LGSIL): Secondary | ICD-10-CM | POA: Insufficient documentation

## 2020-09-02 DIAGNOSIS — N870 Mild cervical dysplasia: Secondary | ICD-10-CM

## 2020-09-02 DIAGNOSIS — Z01812 Encounter for preprocedural laboratory examination: Secondary | ICD-10-CM

## 2020-09-02 LAB — PREGNANCY, URINE: Preg Test, Ur: NEGATIVE

## 2020-09-02 NOTE — Patient Instructions (Signed)
Colposcopy, Care After This sheet gives you information about how to care for yourself after your procedure. Your doctor may also give you more specific instructions. If youhave problems or questions, contact your doctor. What can I expect after the procedure? If you did not have a sample of your tissue taken out (did not have a biopsy), you may only have some spotting of blood for a few days. You can go back toyour normal activities. If you had a sample of your tissue taken out, it is common to have: Soreness and mild pain. These may last for a few days. A light-headed feeling. Mild bleeding or fluid (discharge) coming from your vagina. The fluid will look dark and grainy. You may have this for a few days. The fluid may be caused by a liquid that was used during your procedure. You may need to wear a sanitary pad. Spotting of blood for at least 48 hours after the procedure. Follow these instructions at home: Medicines Take over-the-counter and prescription medicines only as told by your doctor. Ask your doctor what medicines you can start taking again. This is very important if you take blood thinners. Activity Limit your activity for the first day after your procedure as told by your doctor. For at least 3 days, or for as long as told by your doctor, avoid: Douching. Using tampons. Having sex. Return to your normal activities as told by your doctor. Ask your doctor what activities are safe for you. General instructions  Drink enough fluid to keep your pee (urine) pale yellow. Ask your doctor if you may take baths, swim, or use a hot tub. You may take showers. If you use birth control (contraception), keep using it. Keep all follow-up visits as told by your doctor. This is important.  Contact a doctor if: You get a skin rash. Get help right away if: You bleed a lot from your vagina. A lot of bleeding means you use more than one pad an hour for 2 hours in a row. You have clumps of  blood (blood clots) coming from your vagina. You have a fever or chills. You have signs of infection. This may be fluid coming from your vagina that is: Different than normal. Yellow. Bad-smelling. You have very bad pain or cramps in your lower belly that do not get better with medicine. You faint. Summary If you did not have a sample of your tissue taken out, you may only have some spotting of blood for a few days. You can go back to your normal activities. If you had a sample of your tissue taken out, it is common to have mild pain for a few days and spotting for 48 hours. Avoid douching, using tampons, and having sex for at least 3 days after the procedure or for as long as told. Get help right away if you have a lot of bleeding, very bad pain, or signs of infection. This information is not intended to replace advice given to you by your health care provider. Make sure you discuss any questions you have with your healthcare provider. Document Revised: 12/31/2019 Document Reviewed: 02/27/2019 Elsevier Patient Education  2022 Elsevier Inc.  

## 2020-09-04 LAB — SURGICAL PATHOLOGY

## 2020-09-09 ENCOUNTER — Ambulatory Visit: Payer: Medicaid Other | Admitting: Obstetrics and Gynecology

## 2020-09-29 ENCOUNTER — Encounter: Payer: Self-pay | Admitting: Obstetrics and Gynecology

## 2020-09-30 ENCOUNTER — Ambulatory Visit (INDEPENDENT_AMBULATORY_CARE_PROVIDER_SITE_OTHER): Payer: BC Managed Care – PPO | Admitting: Obstetrics and Gynecology

## 2020-09-30 ENCOUNTER — Other Ambulatory Visit: Payer: Self-pay

## 2020-09-30 ENCOUNTER — Encounter: Payer: Self-pay | Admitting: Obstetrics and Gynecology

## 2020-09-30 VITALS — BP 100/64

## 2020-09-30 DIAGNOSIS — N760 Acute vaginitis: Secondary | ICD-10-CM | POA: Diagnosis not present

## 2020-09-30 DIAGNOSIS — N898 Other specified noninflammatory disorders of vagina: Secondary | ICD-10-CM | POA: Diagnosis not present

## 2020-09-30 LAB — WET PREP FOR TRICH, YEAST, CLUE

## 2020-09-30 MED ORDER — METRONIDAZOLE 0.75 % VA GEL: VAGINAL | 0 refills | Status: DC

## 2020-09-30 MED ORDER — FLUCONAZOLE 150 MG PO TABS: 150.0000 mg | ORAL_TABLET | Freq: Once | ORAL | 0 refills | Status: AC

## 2020-09-30 NOTE — Progress Notes (Signed)
GYNECOLOGY  VISIT   HPI: 30 y.o.   Single  African American  female   G0P0000 with Patient's last menstrual period was 09/19/2020 (approximate).   here for vaginal odor and irritation.  Some discharge.   Had BV 06/29/20 and 08/08/20.   Her Ex is back in her life.  Not sexually active with him.  Used a different soap recently.   GYNECOLOGIC HISTORY: Patient's last menstrual period was 09/19/2020 (approximate). Contraception: Ortho Evra/condoms everytime Menopausal hormone therapy:  n/a Last mammogram:  n/a Last pap smear: 07-29-20 LGSIL:Pos HR HPV, colposcopy LGSIL. 04-16-19 ASCUS:Pos HR HPV, 02-07-18 Neg        OB History     Gravida  0   Para  0   Term  0   Preterm  0   AB  0   Living  0      SAB  0   IAB  0   Ectopic  0   Multiple  0   Live Births  0              Patient Active Problem List   Diagnosis Date Noted   Fibroids, submucosal 05/03/2018   Menorrhagia with regular cycle 05/03/2018    Past Medical History:  Diagnosis Date   Abnormal uterine bleeding    Anxiety    CIN I (cervical intraepithelial neoplasia I) 2021   Depression    Dysmenorrhea    Elevated bilirubin    Fibroid uterus    Heart murmur    HSV-1 (herpes simplex virus 1) infection    Low vitamin D level     Past Surgical History:  Procedure Laterality Date   NO PAST SURGERIES      Current Outpatient Medications  Medication Sig Dispense Refill   ibuprofen (ADVIL) 800 MG tablet Take 1 tablet (800 mg total) by mouth every 8 (eight) hours as needed for cramping. 100 tablet 3   norelgestromin-ethinyl estradiol (ORTHO EVRA) 150-35 MCG/24HR transdermal patch Place 1 patch onto the skin once a week. 9 patch 4   No current facility-administered medications for this visit.     ALLERGIES: Lactose intolerance (gi)  Family History  Problem Relation Age of Onset   Hypertension Father     Social History   Socioeconomic History   Marital status: Single    Spouse name: Not  on file   Number of children: Not on file   Years of education: Not on file   Highest education level: Not on file  Occupational History   Not on file  Tobacco Use   Smoking status: Never   Smokeless tobacco: Never  Vaping Use   Vaping Use: Never used  Substance and Sexual Activity   Alcohol use: Yes    Alcohol/week: 2.0 standard drinks    Types: 2 Glasses of wine per week    Comment: occ glass of wine   Drug use: Never   Sexual activity: Yes    Birth control/protection: Condom  Other Topics Concern   Not on file  Social History Narrative   Not on file   Social Determinants of Health   Financial Resource Strain: Not on file  Food Insecurity: Not on file  Transportation Needs: Not on file  Physical Activity: Not on file  Stress: Not on file  Social Connections: Not on file  Intimate Partner Violence: Not on file    Review of Systems  Genitourinary:  Positive for vaginal discharge (vaginal irritation and odor).  All other systems  reviewed and are negative.  PHYSICAL EXAMINATION:    BP 100/64   LMP 09/19/2020 (Approximate)     General appearance: alert, cooperative and appears stated age  Pelvic: External genitalia:  no lesions              Urethra:  normal appearing urethra with no masses, tenderness or lesions              Bartholins and Skenes: normal                 Vagina: normal appearing vagina with normal color and discharge, no lesions              Cervix: no lesions.  Clumpy white discharge.                Bimanual Exam:  Uterus:  normal size, contour, position, consistency, mobility, non-tender              Adnexa: no mass, fullness, tenderness          Chaperone was present for exam:  Estill Bamberg, CMA  ASSESSMENT  Vaginitis: Bacterial vaginosis and yeast.. Hx BV.   PLAN  Wet prep:  yeast present, clue cells present with odor, negative trichomonas.  Diflucan course and Metrogel. If she has another episode of BV, will treat with Metrogel for 7 - 10  days and then start a suppression regimen of Metrogel twice weekly for 4 month - 6 months.  We discussed lowering sugar and carbohydrate in her diet. Earle Gell may also be contributing to the infections.   32 min total time was spent for this patient encounter, including preparation, face-to-face counseling with the patient, coordination of care, and documentation of the encounter.

## 2020-09-30 NOTE — Telephone Encounter (Signed)
Please see office visit for patient at next available slot.

## 2020-09-30 NOTE — Telephone Encounter (Signed)
I do recommend an office visit for evaluation and treatment of vaginitis.

## 2020-09-30 NOTE — Patient Instructions (Signed)

## 2021-06-16 ENCOUNTER — Other Ambulatory Visit: Payer: Self-pay | Admitting: Obstetrics and Gynecology

## 2021-06-16 ENCOUNTER — Encounter: Payer: Self-pay | Admitting: Obstetrics and Gynecology

## 2021-06-16 NOTE — Telephone Encounter (Signed)
Please reach out to patient for appointment.  ?

## 2021-06-17 ENCOUNTER — Ambulatory Visit
Admission: EM | Admit: 2021-06-17 | Discharge: 2021-06-17 | Disposition: A | Discharge status: Home / Self Care | Payer: BC Managed Care – PPO | Attending: Emergency Medicine | Admitting: Emergency Medicine

## 2021-06-17 DIAGNOSIS — R8281 Pyuria: Secondary | ICD-10-CM

## 2021-06-17 DIAGNOSIS — N898 Other specified noninflammatory disorders of vagina: Secondary | ICD-10-CM

## 2021-06-17 DIAGNOSIS — N760 Acute vaginitis: Secondary | ICD-10-CM

## 2021-06-17 LAB — POCT URINALYSIS DIP (MANUAL ENTRY)
Bilirubin, UA: NEGATIVE
Blood, UA: NEGATIVE
Glucose, UA: NEGATIVE mg/dL
Nitrite, UA: NEGATIVE
Protein Ur, POC: NEGATIVE mg/dL
Spec Grav, UA: 1.015 (ref 1.010–1.025)
Urobilinogen, UA: 0.2 E.U./dL
pH, UA: 6 (ref 5.0–8.0)

## 2021-06-17 LAB — POCT URINE PREGNANCY: Preg Test, Ur: NEGATIVE

## 2021-06-17 MED ORDER — METRONIDAZOLE 0.75 % VA GEL: 1.0000 | Freq: Every day | VAGINAL | 0 refills | Status: AC

## 2021-06-17 MED ORDER — FLUCONAZOLE 150 MG PO TABS: ORAL_TABLET | ORAL | 0 refills | Status: DC

## 2021-06-17 NOTE — ED Triage Notes (Signed)
Pt c/o vaginal discharge, itching and odor that began yesterday.  ?

## 2021-06-17 NOTE — Discharge Instructions (Addendum)
Based on the history that you provided to me today, you were treated empirically for bacterial vaginitis with metronidazole gel, please insert 1 applicatorful once daily at bedtime for the next 5 nights.  Please abstain from sexual intercourse, tampon use while being treated. ?  ?Based on the history that you provided to me today, you were treated empirically for vaginal candidiasis with fluconazole (Diflucan), take the first tablet today and take the second tablet three days after the first tablet.  Please abstain from sexual intercourse, tampon use while being treated. ?  ?Your urine pregnancy test today is negative. ?  ?The results of your STD testing today will be made available to you once they are complete, this typically takes 3 to 5 days.  They will initially be posted to your MyChart and, if any of your results are abnormal, you will receive a phone call with those results along with further instructions regarding any further treatment, if needed.  ? ?The urinalysis that we performed in the clinic today was abnormal.  Urine culture will be performed per our protocol.  The result of the urine culture will be available in the next 3 to 5 days and will be posted to your MyChart account.  If there is an abnormal finding, you will be contacted by phone and advised of further treatment recommendations, if any. ?  ?If you have not had complete resolution of your symptoms after completing treatment, please return for repeat evaluation. ?  ?Thank you for visiting urgent care today.  I appreciate the opportunity to participate in your care. ? ?

## 2021-06-17 NOTE — ED Provider Notes (Signed)
?[facility] ? ? ? ?CSN: [id number] ?Arrival date & time: 06/17/21  1513 ?  ? ?HISTORY  ? ?Chief Complaint  ?Patient presents with  ? Vaginal Discharge  ? Vaginal Itching  ? ?HPI ?Valerie Peters is a 31 y.o. female. Pt c/o vaginal discharge, itching and odor that began yesterday.  Patient states she had both BV and vaginal Candida in the past, states she is having symptoms that are a little bit of both.  Patient states she does better with metronidazole gel that she does with the tablets which gave her a really bad headache.  Patient denies genital lesions, pelvic pain, pelvic pressure, dyspareunia, burning with urination.  Urine pregnancy test today is negative.  Urine dip revealed a small amount of leukocytes but was otherwise essentially normal. ? ?The history is provided by the patient.  ?Past Medical History:  ?Diagnosis Date  ? Abnormal uterine bleeding   ? Anxiety   ? CIN I (cervical intraepithelial neoplasia I) 2021  ? Depression   ? Dysmenorrhea   ? Elevated bilirubin   ? Fibroid uterus   ? Heart murmur   ? HSV-1 (herpes simplex virus 1) infection   ? Low vitamin D level   ? ?Patient Active Problem List  ? Diagnosis Date Noted  ? Fibroids, submucosal 05/03/2018  ? Menorrhagia with regular cycle 05/03/2018  ? ?Past Surgical History:  ?Procedure Laterality Date  ? NO PAST SURGERIES    ? ?OB History   ? ? Gravida  ?0  ? Para  ?0  ? Term  ?0  ? Preterm  ?0  ? AB  ?0  ? Living  ?0  ?  ? ? SAB  ?0  ? IAB  ?0  ? Ectopic  ?0  ? Multiple  ?0  ? Live Births  ?0  ?   ?  ?  ? ?Home Medications   ? ?Prior to Admission medications   ?Medication Sig Start Date End Date Taking? Authorizing Provider  ?ibuprofen (ADVIL) 800 MG tablet Take 1 tablet (800 mg total) by mouth every 8 (eight) hours as needed for cramping. 07/29/20   Nunzio Cobbs, MD  ? ?Family History ?Family History  ?Problem Relation Age of Onset  ? Hypertension Father   ? ?Social History ?Social History  ? ?Tobacco Use  ? Smoking status:  Never  ? Smokeless tobacco: Never  ?Vaping Use  ? Vaping Use: Never used  ?Substance Use Topics  ? Alcohol use: Yes  ?  Alcohol/week: 2.0 standard drinks  ?  Types: 2 Glasses of wine per week  ?  Comment: occ glass of wine  ? Drug use: Never  ? ?Allergies   ?Lactose intolerance (gi) ? ?Review of Systems ?Review of Systems ?Pertinent findings noted in history of present illness.  ? ?Physical Exam ?Triage Vital Signs ?ED Triage Vitals  ?Enc Vitals Group  ?   BP 01/08/21 0827 (!) 147/82  ?   Pulse Rate 01/08/21 0827 72  ?   Resp 01/08/21 0827 18  ?   Temp 01/08/21 0827 98.3 ?F (36.8 ?C)  ?   Temp Source 01/08/21 0827 Oral  ?   SpO2 01/08/21 0827 98 %  ?   Weight --   ?   Height --   ?   Head Circumference --   ?   Peak Flow --   ?   Pain Score 01/08/21 0826 5  ?   Pain Loc --   ?  Pain Edu? --   ?   Excl. in [place]? --   ?No data found. ? ?Updated Vital Signs ?BP 133/84 (BP Location: Right Arm)   Pulse 74   Temp 98.1 ?F (36.7 ?C) (Oral)   Resp 18   LMP 05/28/2021   SpO2 98%  ? ?Physical Exam ?Vitals and nursing note reviewed.  ?Constitutional:   ?   General: She is not in acute distress. ?   Appearance: Normal appearance. She is not ill-appearing.  ?HENT:  ?   Head: Normocephalic and atraumatic.  ?Eyes:  ?   General: Lids are normal.     ?   Right eye: No discharge.     ?   Left eye: No discharge.  ?   Extraocular Movements: Extraocular movements intact.  ?   Conjunctiva/sclera: Conjunctivae normal.  ?   Right eye: Right conjunctiva is not injected.  ?   Left eye: Left conjunctiva is not injected.  ?Neck:  ?   Trachea: Trachea and phonation normal.  ?Cardiovascular:  ?   Rate and Rhythm: Normal rate and regular rhythm.  ?   Pulses: Normal pulses.  ?   Heart sounds: Normal heart sounds. No murmur heard. ?  No friction rub. No gallop.  ?Pulmonary:  ?   Effort: Pulmonary effort is normal. No accessory muscle usage, prolonged expiration or respiratory distress.  ?   Breath sounds: Normal breath sounds. No stridor,  decreased air movement or transmitted upper airway sounds. No decreased breath sounds, wheezing, rhonchi or rales.  ?Chest:  ?   Chest wall: No tenderness.  ?Genitourinary: ?   Comments: Patient politely declines pelvic exam today, patient provided a vaginal swab for testing. ?Musculoskeletal:     ?   General: Normal range of motion.  ?   Cervical back: Normal range of motion and neck supple. Normal range of motion.  ?Lymphadenopathy:  ?   Cervical: No cervical adenopathy.  ?Skin: ?   General: Skin is warm and dry.  ?   Findings: No erythema or rash.  ?Neurological:  ?   General: No focal deficit present.  ?   Mental Status: She is alert and oriented to person, place, and time.  ?Psychiatric:     ?   Mood and Affect: Mood normal.     ?   Behavior: Behavior normal.  ? ? ?Visual Acuity ?Right Eye Distance:   ?Left Eye Distance:   ?Bilateral Distance:   ? ?Right Eye Near:   ?Left Eye Near:    ?Bilateral Near:    ? ?UC Couse / Diagnostics / Procedures:  ?  ?EKG ? ?Radiology ?No results found. ? ?Procedures ?Procedures (including critical care time) ? ?UC Diagnoses / Final Clinical Impressions(s)   ?I have reviewed the triage vital signs and the nursing notes. ? ?Pertinent labs & imaging results that were available during my care of the patient were reviewed by me and considered in my medical decision making (see chart for details).   ? ?Final diagnoses:  ?White vaginal discharge  ?Foul smelling vaginal discharge  ?Pyuria  ?Vaginitis and vulvovaginitis  ? ?Patient was provided with Metronidazole gel inserted daily at bedtime for 5 days for empiric treatment of presumed gardnerella vaginosis based on the history provided to me today. ?  ?Patient was provided with Diflucan 150 mg every 3 days x 2 for empiric treatment of presumed vulvovaginal candidiasis based on the history provided to me today. ?  ?Patient was advised to abstain from sexual  intercourse for the next 7 days while being treated.  Patient was also advised to  use condoms to protect themselves from STD exposure. ?  ?STD screening was performed, patient advised that the results be posted to their MyChart and if any of the results are positive, they will be notified by phone, further treatment will be provided as indicated based on results of STD screening. ?  ?Return precautions advised.  Drug allergies reviewed, all questions addressed.  ? ?Urine dip today was positive for pyuria.  Urine culture will be performed per our protocol.   ?  ?Patient advised that they will be contacted with results of the urine culture and that treatment will be provided as indicated based on results. ?  ?Return precautions advised. ? ?ED Prescriptions   ? ? Medication Sig Dispense Auth. Provider  ? metroNIDAZOLE (METROGEL) 0.75 % vaginal gel Place 1 Applicatorful vaginally at bedtime for 5 days. Abstain from sexual intercourse until treatment is complete 70 g Lynden Oxford Scales, PA-C  ? fluconazole (DIFLUCAN) 150 MG tablet Take 1 tablet today.  Take second tablet 3 days later. 2 tablet Lynden Oxford Scales, PA-C  ? ?  ? ?PDMP not reviewed this encounter. ? ?Pending results:  ?Labs Reviewed  ?POCT URINALYSIS DIP (MANUAL ENTRY) - Abnormal; Notable for the following components:  ?    Result Value  ? Ketones, POC UA trace (5) (*)   ? Leukocytes, UA Moderate (2+) (*)   ? All other components within normal limits  ?URINE CULTURE  ?POCT URINE PREGNANCY  ?CERVICOVAGINAL ANCILLARY ONLY  ? ? ?Medications Ordered in UC: ?Medications - No data to display ? ?Disposition Upon Discharge:  ?Condition: stable for discharge home ? ?Patient presented with concern for an acute illness with associated systemic symptoms and significant discomfort requiring urgent management. In my opinion, this is a condition that a prudent lay person (someone who possesses an average knowledge of health and medicine) may potentially expect to result in complications if not addressed urgently such as respiratory distress,  impairment of bodily function or dysfunction of bodily organs.  ? ?As such, the patient has been evaluated and assessed, work-up was performed and treatment was provided in alignment with urgent care protocols and evi

## 2021-06-18 ENCOUNTER — Ambulatory Visit: Payer: BC Managed Care – PPO

## 2021-06-20 LAB — URINE CULTURE

## 2021-06-21 LAB — CERVICOVAGINAL ANCILLARY ONLY
Bacterial Vaginitis (gardnerella): POSITIVE — AB
Candida Glabrata: NEGATIVE
Candida Vaginitis: POSITIVE — AB
Chlamydia: NEGATIVE
Comment: NEGATIVE
Comment: NEGATIVE
Comment: NEGATIVE
Comment: NEGATIVE
Comment: NEGATIVE
Comment: NORMAL
Neisseria Gonorrhea: NEGATIVE
Trichomonas: NEGATIVE

## 2021-06-24 NOTE — Progress Notes (Deleted)
GYNECOLOGY  VISIT   HPI: 31 y.o.   Single  African American  female   G0P0000 with No LMP recorded.   here for   vaginal infection  GYNECOLOGIC HISTORY: No LMP recorded. Contraception:  Patch Menopausal hormone therapy:  N/A Last mammogram:  N/A Last pap smear:   07-29-20 LGSIL Pos HR HPV 09-02-20 Colpo LGSIL ECC benign        OB History     Gravida  0   Para  0   Term  0   Preterm  0   AB  0   Living  0      SAB  0   IAB  0   Ectopic  0   Multiple  0   Live Births  0              Patient Active Problem List   Diagnosis Date Noted   Fibroids, submucosal 05/03/2018   Menorrhagia with regular cycle 05/03/2018    Past Medical History:  Diagnosis Date   Abnormal uterine bleeding    Anxiety    CIN I (cervical intraepithelial neoplasia I) 2021   Depression    Dysmenorrhea    Elevated bilirubin    Fibroid uterus    Heart murmur    HSV-1 (herpes simplex virus 1) infection    Low vitamin D level     Past Surgical History:  Procedure Laterality Date   NO PAST SURGERIES      Current Outpatient Medications  Medication Sig Dispense Refill   fluconazole (DIFLUCAN) 150 MG tablet Take 1 tablet today.  Take second tablet 3 days later. 2 tablet 0   ibuprofen (ADVIL) 800 MG tablet Take 1 tablet (800 mg total) by mouth every 8 (eight) hours as needed for cramping. 100 tablet 3   No current facility-administered medications for this visit.     ALLERGIES: Lactose intolerance (gi)  Family History  Problem Relation Age of Onset   Hypertension Father     Social History   Socioeconomic History   Marital status: Single    Spouse name: Not on file   Number of children: Not on file   Years of education: Not on file   Highest education level: Not on file  Occupational History   Not on file  Tobacco Use   Smoking status: Never   Smokeless tobacco: Never  Vaping Use   Vaping Use: Never used  Substance and Sexual Activity   Alcohol use: Yes     Alcohol/week: 2.0 standard drinks    Types: 2 Glasses of wine per week    Comment: occ glass of wine   Drug use: Never   Sexual activity: Yes    Birth control/protection: Condom  Other Topics Concern   Not on file  Social History Narrative   Not on file   Social Determinants of Health   Financial Resource Strain: Not on file  Food Insecurity: Not on file  Transportation Needs: Not on file  Physical Activity: Not on file  Stress: Not on file  Social Connections: Not on file  Intimate Partner Violence: Not on file    Review of Systems  PHYSICAL EXAMINATION:    There were no vitals taken for this visit.    General appearance: alert, cooperative and appears stated age Head: Normocephalic, without obvious abnormality, atraumatic Neck: no adenopathy, supple, symmetrical, trachea midline and thyroid normal to inspection and palpation Lungs: clear to auscultation bilaterally Breasts: normal appearance, no masses or tenderness,  No nipple retraction or dimpling, No nipple discharge or bleeding, No axillary or supraclavicular adenopathy Heart: regular rate and rhythm Abdomen: soft, non-tender, no masses,  no organomegaly Extremities: extremities normal, atraumatic, no cyanosis or edema Skin: Skin color, texture, turgor normal. No rashes or lesions Lymph nodes: Cervical, supraclavicular, and axillary nodes normal. No abnormal inguinal nodes palpated Neurologic: Grossly normal  Pelvic: External genitalia:  no lesions              Urethra:  normal appearing urethra with no masses, tenderness or lesions              Bartholins and Skenes: normal                 Vagina: normal appearing vagina with normal color and discharge, no lesions              Cervix: no lesions                Bimanual Exam:  Uterus:  normal size, contour, position, consistency, mobility, non-tender              Adnexa: no mass, fullness, tenderness              Rectal exam: {yes no:[id number]}.  Confirms.               Anus:  normal sphincter tone, no lesions  Chaperone was present for exam:  ***  ASSESSMENT     PLAN     An After Visit Summary was printed and given to the patient.  ______ minutes face to face time of which over 50% was spent in counseling.

## 2021-07-01 ENCOUNTER — Encounter: Payer: Self-pay | Admitting: Obstetrics and Gynecology

## 2021-07-02 ENCOUNTER — Ambulatory Visit: Payer: BC Managed Care – PPO | Admitting: Obstetrics and Gynecology

## 2021-09-02 ENCOUNTER — Other Ambulatory Visit: Payer: Self-pay | Admitting: Obstetrics and Gynecology

## 2021-09-02 ENCOUNTER — Encounter: Payer: Self-pay | Admitting: Obstetrics and Gynecology

## 2021-09-02 DIAGNOSIS — N946 Dysmenorrhea, unspecified: Secondary | ICD-10-CM

## 2021-09-02 MED ORDER — IBUPROFEN 800 MG PO TABS: 800.0000 mg | ORAL_TABLET | Freq: Three times a day (TID) | ORAL | 0 refills | Status: DC | PRN

## 2021-09-02 NOTE — Progress Notes (Signed)
Rx for Ibuprofen 800 mg every 8 hours as needed.  #30, RF 0.

## 2021-12-01 NOTE — Progress Notes (Signed)
31 y.o. G0P0000 Single African American female here for annual exam.    Periods are painful and heavy for just her second day.  First day is painful also.  Has been using ibuprofen to control her pain.  Has headaches and nausea with her cycles.   Doing ok off of birth control but wants to restart.  Used Ortho Evra in the past and did not have any problems.  The pills made her nausea.   Is really tired, but is improved.  Wants blood work done.  Not taking a MVI.   Did have some hair loss, but it is improving.   Wants STD screening.  Considering future pregnancy.   Working in [place].  Dating the same person.  Will be getting engaged.  Pet passed away.   PCP:  None   Patient's last menstrual period was 11/26/2021 (exact date).     Period Cycle (Days): 30 Period Duration (Days): 5-7 days Period Pattern: Regular Menstrual Flow:  (heavy first 2-3 days, then tapers) Menstrual Control: Maxi pad Menstrual Control Change Freq (Hours): changes maxi pad every 2 hours on heaviest day Dysmenorrhea: (!) Severe Dysmenorrhea Symptoms: Cramping, Nausea, Diarrhea, Headache     Sexually active: Yes.    The current method of family planning is condoms sometimes.    Exercising: No.  The patient does not participate in regular exercise at present.  Some walking Smoker:  no  Health Maintenance: Pap:   07-29-20 LSIL:Pos HR HPV, 04-16-19 ASCUS:Pos HR HPV, 02-07-18 Neg History of abnormal Pap:  yes,  09-02-20 colpo LSIL. 07-29-20 LSIL:Pos HR HPV, 05-01-19 colpo revealed LGSIL;Pap 04-16-19 ASCUS:Pos HR HPV--no treatment. Had colpo age 30 with no treatment. MMG:  n/a Colonoscopy:  n/a BMD:   n/a  Result  n/a TDaP:  thinks within 10 years Gardasil:   COMPLETED HIV: 07-29-20 NR Hep C: 07-29-20 Neg Screening Labs:   today.    reports that she has never smoked. She has never used smokeless tobacco. She reports current alcohol use of about 2.0 standard drinks of alcohol per week. She reports current  drug use. Drug: Marijuana.  Past Medical History:  Diagnosis Date   Abnormal uterine bleeding    Anxiety    CIN I (cervical intraepithelial neoplasia I) 2021   Depression    Dysmenorrhea    Elevated bilirubin    Fibroid uterus    Heart murmur    HSV-1 (herpes simplex virus 1) infection    Low vitamin D level     Past Surgical History:  Procedure Laterality Date   NO PAST SURGERIES      Current Outpatient Medications  Medication Sig Dispense Refill   ibuprofen (ADVIL) 800 MG tablet Take 1 tablet (800 mg total) by mouth every 8 (eight) hours as needed for cramping. 30 tablet 0   No current facility-administered medications for this visit.    Family History  Problem Relation Age of Onset   Hypertension Father     Review of Systems  Constitutional:  Positive for fatigue.       Hair loss  All other systems reviewed and are negative.   Exam:   BP 100/60   Pulse 77   Ht 5' 7.75" (1.721 m)   Wt 143 lb (64.9 kg)   LMP 11/26/2021 (Exact Date)   SpO2 97%   BMI 21.90 kg/m     General appearance: alert, cooperative and appears stated age Head: normocephalic, without obvious abnormality, atraumatic Neck: no adenopathy, supple, symmetrical, trachea midline and  thyroid normal to inspection and palpation Lungs: clear to auscultation bilaterally Breasts: normal appearance, no masses or tenderness, No nipple retraction or dimpling, No nipple discharge or bleeding, No axillary adenopathy Heart: regular rate and rhythm Abdomen: soft, non-tender; no masses, no organomegaly Extremities: extremities normal, atraumatic, no cyanosis or edema Skin: skin color, texture, turgor normal. No rashes or lesions Lymph nodes: cervical, supraclavicular, and axillary nodes normal. Neurologic: grossly normal  Pelvic: External genitalia:  no lesions              No abnormal inguinal nodes palpated.              Urethra:  normal appearing urethra with no masses, tenderness or lesions               Bartholins and Skenes: normal                 Vagina: normal appearing vagina with normal color and discharge, no lesions              Cervix: no lesions              Pap taken: yes Bimanual Exam:  Uterus:  normal size, contour, position, consistency, mobility, non-tender              Adnexa: no mass, fullness, tenderness             Chaperone was present for exam:  Estill Bamberg, CMA  Assessment:   Well woman visit with gynecologic exam. Fibroids.  Partial submucous component.  Dysmenorrhea. Hx LGSIL.  Fatigue and hair loss.  HSV I.  STD screening.   Plan: Mammogram screening discussed. Self breast awareness reviewed. Pap and HR HPV collected.  Guidelines for Calcium, Vitamin D, regular exercise program including cardiovascular and weight bearing exercise. Check CBC, TSH, vit B12, and vit D.  STD screening.  Rx for OrthoEvra.   Rx for Motrin.  Start a PNV.  We dicussed avoiding exposures when trying for pregnancy.  I rewiewed recommended reading in preparation for pregnancy.  Flu vaccine recommended.  Follow up annually and prn.   After visit summary provided.

## 2021-12-06 ENCOUNTER — Encounter: Payer: Self-pay | Admitting: Obstetrics and Gynecology

## 2021-12-06 ENCOUNTER — Other Ambulatory Visit (HOSPITAL_COMMUNITY)
Admission: RE | Admit: 2021-12-06 | Discharge: 2021-12-06 | Disposition: A | Discharge status: Home / Self Care | Payer: Medicaid Other | Source: Ambulatory Visit | Attending: Obstetrics and Gynecology | Admitting: Obstetrics and Gynecology

## 2021-12-06 ENCOUNTER — Ambulatory Visit (INDEPENDENT_AMBULATORY_CARE_PROVIDER_SITE_OTHER): Payer: Medicaid Other | Admitting: Obstetrics and Gynecology

## 2021-12-06 VITALS — BP 100/60 | HR 77 | Ht 67.75 in | Wt 143.0 lb

## 2021-12-06 DIAGNOSIS — Z124 Encounter for screening for malignant neoplasm of cervix: Secondary | ICD-10-CM | POA: Insufficient documentation

## 2021-12-06 DIAGNOSIS — Z113 Encounter for screening for infections with a predominantly sexual mode of transmission: Secondary | ICD-10-CM

## 2021-12-06 DIAGNOSIS — N946 Dysmenorrhea, unspecified: Secondary | ICD-10-CM

## 2021-12-06 DIAGNOSIS — Z3041 Encounter for surveillance of contraceptive pills: Secondary | ICD-10-CM | POA: Diagnosis not present

## 2021-12-06 DIAGNOSIS — Z30011 Encounter for initial prescription of contraceptive pills: Secondary | ICD-10-CM

## 2021-12-06 DIAGNOSIS — Z01419 Encounter for gynecological examination (general) (routine) without abnormal findings: Secondary | ICD-10-CM | POA: Diagnosis not present

## 2021-12-06 DIAGNOSIS — B009 Herpesviral infection, unspecified: Secondary | ICD-10-CM

## 2021-12-06 DIAGNOSIS — R7989 Other specified abnormal findings of blood chemistry: Secondary | ICD-10-CM

## 2021-12-06 DIAGNOSIS — Z114 Encounter for screening for human immunodeficiency virus [HIV]: Secondary | ICD-10-CM

## 2021-12-06 DIAGNOSIS — Z1159 Encounter for screening for other viral diseases: Secondary | ICD-10-CM

## 2021-12-06 DIAGNOSIS — R5383 Other fatigue: Secondary | ICD-10-CM

## 2021-12-06 MED ORDER — IBUPROFEN 800 MG PO TABS: 800.0000 mg | ORAL_TABLET | Freq: Three times a day (TID) | ORAL | 2 refills | Status: DC | PRN

## 2021-12-06 MED ORDER — NORELGESTROMIN-ETH ESTRADIOL 150-35 MCG/24HR TD PTWK: 1.0000 | MEDICATED_PATCH | TRANSDERMAL | 4 refills | Status: DC

## 2021-12-06 NOTE — Patient Instructions (Signed)

## 2021-12-07 LAB — CBC
HCT: 39.7 % (ref 35.0–45.0)
Hemoglobin: 13.4 g/dL (ref 11.7–15.5)
MCH: 35.1 pg — ABNORMAL HIGH (ref 27.0–33.0)
MCHC: 33.8 g/dL (ref 32.0–36.0)
MCV: 103.9 fL — ABNORMAL HIGH (ref 80.0–100.0)
MPV: 10.9 fL (ref 7.5–12.5)
Platelets: 229 10*3/uL (ref 140–400)
RBC: 3.82 10*6/uL (ref 3.80–5.10)
RDW: 11.3 % (ref 11.0–15.0)
WBC: 5.5 10*3/uL (ref 3.8–10.8)

## 2021-12-07 LAB — HIV ANTIBODY (ROUTINE TESTING W REFLEX): HIV 1&2 Ab, 4th Generation: NONREACTIVE

## 2021-12-07 LAB — TSH: TSH: 3.03 mIU/L

## 2021-12-07 LAB — VITAMIN B12: Vitamin B-12: 598 pg/mL (ref 200–1100)

## 2021-12-07 LAB — RPR: RPR Ser Ql: NONREACTIVE

## 2021-12-07 LAB — VITAMIN D 25 HYDROXY (VIT D DEFICIENCY, FRACTURES): Vit D, 25-Hydroxy: 18 ng/mL — ABNORMAL LOW (ref 30–100)

## 2021-12-07 LAB — HEPATITIS C ANTIBODY: Hepatitis C Ab: NONREACTIVE

## 2021-12-10 ENCOUNTER — Other Ambulatory Visit: Payer: Self-pay | Admitting: *Deleted

## 2021-12-10 LAB — CYTOLOGY - PAP
Chlamydia: NEGATIVE
Comment: NEGATIVE
Comment: NEGATIVE
Comment: NEGATIVE
Comment: NORMAL
Diagnosis: NEGATIVE
Diagnosis: REACTIVE
High risk HPV: NEGATIVE
Neisseria Gonorrhea: NEGATIVE
Trichomonas: NEGATIVE

## 2021-12-10 MED ORDER — VITAMIN D (ERGOCALCIFEROL) 1.25 MG (50000 UNIT) PO CAPS: 50000.0000 [IU] | ORAL_CAPSULE | ORAL | 0 refills | Status: DC

## 2021-12-10 NOTE — Addendum Note (Signed)
Addended by: Yisroel Ramming, Dietrich Pates E on: 12/10/2021 01:27 PM   Modules accepted: Orders

## 2022-02-16 ENCOUNTER — Ambulatory Visit
Admission: EM | Admit: 2022-02-16 | Discharge: 2022-02-16 | Disposition: A | Discharge status: Home / Self Care | Payer: Medicaid Other | Attending: Emergency Medicine | Admitting: Emergency Medicine

## 2022-02-16 DIAGNOSIS — R6883 Chills (without fever): Secondary | ICD-10-CM | POA: Insufficient documentation

## 2022-02-16 DIAGNOSIS — B349 Viral infection, unspecified: Secondary | ICD-10-CM | POA: Diagnosis not present

## 2022-02-16 DIAGNOSIS — R059 Cough, unspecified: Secondary | ICD-10-CM | POA: Diagnosis present

## 2022-02-16 DIAGNOSIS — R519 Headache, unspecified: Secondary | ICD-10-CM | POA: Diagnosis not present

## 2022-02-16 DIAGNOSIS — Z1152 Encounter for screening for COVID-19: Secondary | ICD-10-CM | POA: Diagnosis not present

## 2022-02-16 LAB — RESP PANEL BY RT-PCR (FLU A&B, COVID) ARPGX2
Influenza A by PCR: NEGATIVE
Influenza B by PCR: POSITIVE — AB
SARS Coronavirus 2 by RT PCR: NEGATIVE

## 2022-02-16 MED ORDER — IBUPROFEN 800 MG PO TABS: 800.0000 mg | ORAL_TABLET | Freq: Once | ORAL | Status: DC

## 2022-02-16 MED ORDER — ACETAMINOPHEN 325 MG PO TABS: 975.0000 mg | ORAL_TABLET | Freq: Once | ORAL | Status: DC

## 2022-02-16 MED ORDER — KETOROLAC TROMETHAMINE 30 MG/ML IJ SOLN
30.0000 mg | Freq: Once | INTRAMUSCULAR | Status: AC
  Administered 2022-02-16: 30 mg via INTRAMUSCULAR

## 2022-02-16 NOTE — Discharge Instructions (Signed)
You received a COVID-19 and influenza PCR test today.  The results of your PCR testing will be posted to your MyChart once it is complete.  This typically takes 6 to 12 hours.    If your influenza PCR test is positive, you will be contacted by phone.  Please complete full 5-day course of Tamiflu, a prescription will be provided for you.     If your COVID-19 PCR test is positive, you will be contacted by phone.  Because you do not have a history of being immune compromised, you are currently vaccinated for COVID-19, you are under the age of 38, you do not have a risk of severe disease due to COVID-19, antiviral treatment is not indicated.    If both your second COVID-19 test is negative and your influenza test is also negative, then you can safely assume that your illness is due to one of the many less serious illnesses circulating in our community right now.  Conservative care is recommended with rest, drinking plenty of clear fluids, eating only when hungry, taking supportive medications for your symptoms and avoiding being around other people.  Please remain at home until you are fever free for 24 hours without the use of antifever medications such as Tylenol and ibuprofen.     Based on my physical exam findings and the history you have provided  today, I do not recommend antibiotics at this time.  I do not believe the risks and side effects of antibiotics would outweigh any minimal benefit that they might provide.         Advil, Motrin (ibuprofen): This is a good anti-inflammatory medication which addresses aches, pains and inflammation of the upper airways that causes sinus and nasal congestion as well as in the lower airways which makes your cough feel tight and sometimes burn.  I recommend that you take between 400 to 600 mg every 6-8 hours as needed.      Tylenol (acetaminophen): This is a good fever reducer.  If your body temperature rises above 101.5 as measured with a thermometer, it is  recommended that you take 1,000 mg every 8 hours until your temperature falls below 101.5, please not take more than 3,000 mg of acetaminophen either as a separate medication or as in ingredient in an over-the-counter cold/flu preparation within a 24-hour period.      Please follow-up within the next 5-7 days either with your primary care provider or urgent care if your symptoms do not resolve.  If you do not have a primary care provider, we will assist you in finding one.        Thank you for visiting urgent care today.  We appreciate the opportunity to participate in your care.

## 2022-02-16 NOTE — ED Triage Notes (Signed)
Pt c/o generalized body aches, chills, headache, cough, and sneezing that began 2 days ago.   At home Covid test was negative per pt today.   Home interventions: dayquil/nyquil, ibuprofen, vitamin c

## 2022-02-16 NOTE — ED Provider Notes (Addendum)
[facility]    CSN: [id number] Arrival date & time: 02/16/22  1120    HISTORY   Chief Complaint  Patient presents with   Generalized Body Aches   Cough   Chills   Headache   HPI Valerie Peters is a pleasant, 31 y.o. female who presents to urgent care today. Patient complains of body aches, chills, headache, nonproductive cough, sneezing that began 2 days ago.  Patient states she has never felt this sick before.  Patient denies known sick contacts, history of allergies or asthma.  Patient states she has been taking ibuprofen 800 mg because she is having her period right now, last dose was last night.  Patient states she also used Vicks vapor rub and NyQuil last night but threw the NyQuil up.  Patient states she has not had much of an appetite but is tolerating liquids p.o.  Patient denies diarrhea, ear pain, nasal congestion, sinus pressure.   Cough Associated symptoms: headaches   Headache Associated symptoms: cough    Past Medical History:  Diagnosis Date   Abnormal uterine bleeding    Anxiety    CIN I (cervical intraepithelial neoplasia I) 2021   Depression    Dysmenorrhea    Elevated bilirubin    Fibroid uterus    Heart murmur    HSV-1 (herpes simplex virus 1) infection    Low vitamin D level    Patient Active Problem List   Diagnosis Date Noted   Fibroids, submucosal 05/03/2018   Menorrhagia with regular cycle 05/03/2018   Past Surgical History:  Procedure Laterality Date   NO PAST SURGERIES     OB History     Gravida  0   Para  0   Term  0   Preterm  0   AB  0   Living  0      SAB  0   IAB  0   Ectopic  0   Multiple  0   Live Births  0          Home Medications    Prior to Admission medications   Medication Sig Start Date End Date Taking? Authorizing Provider  ibuprofen (ADVIL) 800 MG tablet Take 1 tablet (800 mg total) by mouth every 8 (eight) hours as needed for cramping. 12/06/21   Nunzio Cobbs, MD   Vitamin D, Ergocalciferol, (DRISDOL) 1.25 MG (50000 UNIT) CAPS capsule Take 1 capsule (50,000 Units total) by mouth every 7 (seven) days. 12/10/21   Nunzio Cobbs, MD    Family History Family History  Problem Relation Age of Onset   Hypertension Father    Social History Social History   Tobacco Use   Smoking status: Never   Smokeless tobacco: Never  Vaping Use   Vaping Use: Never used  Substance Use Topics   Alcohol use: Yes    Alcohol/week: 2.0 standard drinks of alcohol    Types: 2 Glasses of wine per week    Comment: occ glass of wine   Drug use: Yes    Types: Marijuana    Comment: occ.   Allergies   Lactose intolerance (gi)  Review of Systems Review of Systems  Respiratory:  Positive for cough.   Neurological:  Positive for headaches.   Pertinent findings revealed after performing a 14 point review of systems has been noted in the history of present illness.  Physical Exam Triage Vital Signs ED Triage Vitals  Enc Vitals Group  BP 01/08/21 0827 (!) 147/82     Pulse Rate 01/08/21 0827 72     Resp 01/08/21 0827 18     Temp 01/08/21 0827 98.3 F (36.8 C)     Temp Source 01/08/21 0827 Oral     SpO2 01/08/21 0827 98 %     Weight --      Height --      Head Circumference --      Peak Flow --      Pain Score 01/08/21 0826 5     Pain Loc --      Pain Edu? --      Excl. in [place]? --   No data found.  Updated Vital Signs BP 134/88 (BP Location: Left Arm)   Pulse (!) 101   Temp 99.3 F (37.4 C) (Oral)   Resp 20   LMP 02/14/2022   SpO2 98%   Physical Exam Vitals and nursing note reviewed.  Constitutional:      General: She is not in acute distress.    Appearance: Normal appearance. She is not ill-appearing.  HENT:     Head: Normocephalic and atraumatic.     Salivary Glands: Right salivary gland is not diffusely enlarged or tender. Left salivary gland is not diffusely enlarged or tender.     Right Ear: Tympanic membrane, ear canal and  external ear normal. No drainage. No middle ear effusion. There is no impacted cerumen. Tympanic membrane is not erythematous or bulging.     Left Ear: Tympanic membrane, ear canal and external ear normal. No drainage.  No middle ear effusion. There is no impacted cerumen. Tympanic membrane is not erythematous or bulging.     Nose: Nose normal. No nasal deformity, septal deviation, mucosal edema, congestion or rhinorrhea.     Right Turbinates: Not enlarged, swollen or pale.     Left Turbinates: Not enlarged, swollen or pale.     Right Sinus: No maxillary sinus tenderness or frontal sinus tenderness.     Left Sinus: No maxillary sinus tenderness or frontal sinus tenderness.     Mouth/Throat:     Lips: Pink. No lesions.     Mouth: Mucous membranes are moist. No oral lesions.     Pharynx: Oropharynx is clear. Uvula midline. No posterior oropharyngeal erythema or uvula swelling.     Tonsils: No tonsillar exudate. 0 on the right. 0 on the left.  Eyes:     General: Lids are normal.        Right eye: No discharge.        Left eye: No discharge.     Extraocular Movements: Extraocular movements intact.     Conjunctiva/sclera: Conjunctivae normal.     Right eye: Right conjunctiva is not injected.     Left eye: Left conjunctiva is not injected.  Neck:     Trachea: Trachea and phonation normal.  Cardiovascular:     Rate and Rhythm: Regular rhythm. Tachycardia present.     Pulses: Normal pulses.     Heart sounds: Normal heart sounds. No murmur heard.    No friction rub. No gallop.  Pulmonary:     Effort: Pulmonary effort is normal. No accessory muscle usage, prolonged expiration or respiratory distress.     Breath sounds: Normal breath sounds. No stridor, decreased air movement or transmitted upper airway sounds. No decreased breath sounds, wheezing, rhonchi or rales.  Chest:     Chest wall: No tenderness.  Musculoskeletal:        General:  Normal range of motion.     Cervical back: Normal range  of motion and neck supple. Normal range of motion.  Lymphadenopathy:     Cervical: No cervical adenopathy.  Skin:    General: Skin is warm and dry.     Findings: No erythema or rash.  Neurological:     General: No focal deficit present.     Mental Status: She is alert and oriented to person, place, and time.  Psychiatric:        Attention and Perception: Attention and perception normal.        Mood and Affect: Mood is anxious. Affect is tearful.        Speech: Speech normal.        Behavior: Behavior normal. Behavior is cooperative.        Thought Content: Thought content normal.        Cognition and Memory: Cognition normal.        Judgment: Judgment normal.     Visual Acuity Right Eye Distance:   Left Eye Distance:   Bilateral Distance:    Right Eye Near:   Left Eye Near:    Bilateral Near:     [facility] Couse / Diagnostics / Procedures:     Radiology No results found.  Procedures Procedures (including critical care time) EKG  Pending results:  Labs Reviewed  RESP PANEL BY RT-PCR (FLU A&B, COVID) [id number]    Medications Ordered in UC: Medications  ketorolac (TORADOL) 30 MG/ML injection 30 mg (30 mg Intramuscular Given 02/16/22 1233)    UC Diagnoses / Final Clinical Impressions(s)   I have reviewed the triage vital signs and the nursing notes.  Pertinent labs & imaging results that were available during my care of the patient were reviewed by me and considered in my medical decision making (see chart for details).    Final diagnoses:  Viral illness   Patient appears anxious and tearful during her visit today.  Physical exam is unremarkable otherwise.  Repeat COVID-19 testing recommended, we will also perform influenza testing given slightly elevated temperature and elevated heart rate on arrival.  Patient requested an injection for pain during her visit today, ketorolac was provided.  Conservative care recommended.  Supportive medications advised.  Return precautions  advised.  Please see discharge instructions below for further details of plan of care.  ED Prescriptions   None    PDMP not reviewed this encounter.  Disposition Upon Discharge:  Condition: stable for discharge home Home: take medications as prescribed; routine discharge instructions as discussed; follow up as advised.  Patient presented with an acute illness with associated systemic symptoms and significant discomfort requiring urgent management. In my opinion, this is a condition that a prudent lay person (someone who possesses an average knowledge of health and medicine) may potentially expect to result in complications if not addressed urgently such as respiratory distress, impairment of bodily function or dysfunction of bodily organs.   Routine symptom specific, illness specific and/or disease specific instructions were discussed with the patient and/or caregiver at length.   As such, the patient has been evaluated and assessed, work-up was performed and treatment was provided in alignment with urgent care protocols and evidence based medicine.  Patient/parent/caregiver has been advised that the patient may require follow up for further testing and treatment if the symptoms continue in spite of treatment, as clinically indicated and appropriate.  If the patient was tested for COVID-19, Influenza and/or RSV, then the patient/parent/guardian was advised to isolate  at home pending the results of his/her diagnostic coronavirus test and potentially longer if they're positive. I have also advised pt that if his/her COVID-19 test returns positive, it's recommended to self-isolate for at least 10 days after symptoms first appeared AND until fever-free for 24 hours without fever reducer AND other symptoms have improved or resolved. Discussed self-isolation recommendations as well as instructions for household member/close contacts as per the [facility] and [place] DHHS, and also gave patient the [place] packet with  this information.  Patient/parent/caregiver has been advised to return to [facility] or PCP in 3-5 days if no better; to PCP or the Emergency Department if new signs and symptoms develop, or if the current signs or symptoms continue to change or worsen for further workup, evaluation and treatment as clinically indicated and appropriate  The patient will follow up with their current PCP if and as advised. If the patient does not currently have a PCP we will assist them in obtaining one.   The patient may need specialty follow up if the symptoms continue, in spite of conservative treatment and management, for further workup, evaluation, consultation and treatment as clinically indicated and appropriate.  Patient/parent/caregiver verbalized understanding and agreement of plan as discussed.  All questions were addressed during visit.  Please see discharge instructions below for further details of plan.  Discharge Instructions:   Discharge Instructions      You received a COVID-19 and influenza PCR test today.  The results of your PCR testing will be posted to your MyChart once it is complete.  This typically takes 6 to 12 hours.    If your influenza PCR test is positive, you will be contacted by phone.  Please complete full 5-day course of Tamiflu, a prescription will be provided for you.     If your COVID-19 PCR test is positive, you will be contacted by phone.  Because you do not have a history of being immune compromised, you are currently vaccinated for COVID-19, you are under the age of 66, you do not have a risk of severe disease due to COVID-19, antiviral treatment is not indicated.    If both your second COVID-19 test is negative and your influenza test is also negative, then you can safely assume that your illness is due to one of the many less serious illnesses circulating in our community right now.  Conservative care is recommended with rest, drinking plenty of clear fluids, eating only  when hungry, taking supportive medications for your symptoms and avoiding being around other people.  Please remain at home until you are fever free for 24 hours without the use of antifever medications such as Tylenol and ibuprofen.     Based on my physical exam findings and the history you have provided  today, I do not recommend antibiotics at this time.  I do not believe the risks and side effects of antibiotics would outweigh any minimal benefit that they might provide.         Advil, Motrin (ibuprofen): This is a good anti-inflammatory medication which addresses aches, pains and inflammation of the upper airways that causes sinus and nasal congestion as well as in the lower airways which makes your cough feel tight and sometimes burn.  I recommend that you take between 400 to 600 mg every 6-8 hours as needed.      Tylenol (acetaminophen): This is a good fever reducer.  If your body temperature rises above 101.5 as measured with a thermometer, it is  recommended that you take 1,000 mg every 8 hours until your temperature falls below 101.5, please not take more than 3,000 mg of acetaminophen either as a separate medication or as in ingredient in an over-the-counter cold/flu preparation within a 24-hour period.      Please follow-up within the next 5-7 days either with your primary care provider or urgent care if your symptoms do not resolve.  If you do not have a primary care provider, we will assist you in finding one.        Thank you for visiting urgent care today.  We appreciate the opportunity to participate in your care.       This office note has been dictated using Museum/gallery curator.  Unfortunately, this method of dictation can sometimes lead to typographical or grammatical errors.  I apologize for your inconvenience in advance if this occurs.  Please do not hesitate to reach out to me if clarification is needed.      Lynden Oxford Scales, PA-C 02/16/22 1222     Lynden Oxford Scales, PA-C 02/16/22 1252

## 2022-02-17 ENCOUNTER — Telehealth (HOSPITAL_COMMUNITY): Payer: Self-pay | Admitting: Emergency Medicine

## 2022-02-17 MED ORDER — OSELTAMIVIR PHOSPHATE 75 MG PO CAPS: 75.0000 mg | ORAL_CAPSULE | Freq: Two times a day (BID) | ORAL | 0 refills | Status: DC

## 2022-02-25 ENCOUNTER — Other Ambulatory Visit: Payer: Self-pay | Admitting: Obstetrics and Gynecology

## 2022-02-25 NOTE — Telephone Encounter (Signed)
Last annual exam 11/2021  Needs vitamin d level recheck.

## 2022-02-25 NOTE — Telephone Encounter (Signed)
Please have patient schedule her lab appointment.  I have already placed the future order.

## 2022-03-09 ENCOUNTER — Other Ambulatory Visit: Payer: Self-pay | Admitting: Obstetrics and Gynecology

## 2022-03-15 ENCOUNTER — Other Ambulatory Visit: Payer: Medicaid Other

## 2022-03-15 DIAGNOSIS — R7989 Other specified abnormal findings of blood chemistry: Secondary | ICD-10-CM

## 2022-03-16 LAB — VITAMIN D 25 HYDROXY (VIT D DEFICIENCY, FRACTURES): Vit D, 25-Hydroxy: 67 ng/mL (ref 30–100)

## 2022-03-17 ENCOUNTER — Other Ambulatory Visit: Payer: Medicaid Other

## 2022-04-04 ENCOUNTER — Ambulatory Visit: Payer: Medicaid Other | Admitting: Family Medicine

## 2022-05-22 ENCOUNTER — Other Ambulatory Visit: Payer: Self-pay | Admitting: Obstetrics and Gynecology

## 2022-05-22 DIAGNOSIS — N946 Dysmenorrhea, unspecified: Secondary | ICD-10-CM

## 2022-05-23 NOTE — Telephone Encounter (Signed)
AEX 12/06/21.  Original Rx 12/06/21 #60 x 2 refills.

## 2022-07-19 ENCOUNTER — Other Ambulatory Visit: Payer: Self-pay | Admitting: Obstetrics and Gynecology

## 2022-07-19 DIAGNOSIS — N946 Dysmenorrhea, unspecified: Secondary | ICD-10-CM

## 2022-07-20 ENCOUNTER — Other Ambulatory Visit (HOSPITAL_COMMUNITY): Payer: Self-pay

## 2022-07-20 MED ORDER — IBUPROFEN 800 MG PO TABS
800.0000 mg | ORAL_TABLET | Freq: Three times a day (TID) | ORAL | 0 refills | Status: DC | PRN
  Filled 2022-07-20 – 2022-07-25 (×2): qty 60, 20d supply, fill #0

## 2022-07-20 NOTE — Telephone Encounter (Signed)
AEX 12/06/21.

## 2022-07-21 ENCOUNTER — Other Ambulatory Visit (HOSPITAL_COMMUNITY): Payer: Self-pay

## 2022-07-21 ENCOUNTER — Encounter: Payer: Self-pay | Admitting: Pharmacist

## 2022-07-21 ENCOUNTER — Other Ambulatory Visit: Payer: Self-pay

## 2022-07-25 ENCOUNTER — Other Ambulatory Visit (HOSPITAL_COMMUNITY): Payer: Self-pay

## 2022-07-25 ENCOUNTER — Other Ambulatory Visit (HOSPITAL_BASED_OUTPATIENT_CLINIC_OR_DEPARTMENT_OTHER): Payer: Self-pay

## 2022-07-25 ENCOUNTER — Other Ambulatory Visit: Payer: Self-pay

## 2022-09-12 ENCOUNTER — Other Ambulatory Visit: Payer: Self-pay | Admitting: Obstetrics and Gynecology

## 2022-09-12 DIAGNOSIS — N946 Dysmenorrhea, unspecified: Secondary | ICD-10-CM

## 2022-09-12 MED ORDER — IBUPROFEN 800 MG PO TABS
800.0000 mg | ORAL_TABLET | Freq: Three times a day (TID) | ORAL | 0 refills | Status: DC | PRN
  Filled 2022-09-12: qty 60, 20d supply, fill #0

## 2022-09-12 NOTE — Telephone Encounter (Signed)
Medication refill request: ibuprofen 800mg  Last AEX:  12-06-21 with BS Next AEX: 12-08-22 Last MMG (if hormonal medication request): not scheduled Refill authorized: please approve if appropriate

## 2022-09-13 ENCOUNTER — Other Ambulatory Visit (HOSPITAL_COMMUNITY): Payer: Self-pay

## 2022-11-24 NOTE — Progress Notes (Signed)
32 y.o. G0P0000 Single African American female here for annual exam.  Took a bath last month and took Monistat 3.  Gets vaginitis when she takes a bath.  Has low energy.  Wants general blood work.   Still has pain with her menses.  Ibuprofen 800 mg helps. She has a prescription.  Not using [facility].  No partner change.  Considering marriage. Would like to have a baby.  Living in [place].   PCP:   None  Patient's last menstrual period was 12/02/2022.     Period Cycle (Days): 28 Period Duration (Days): 5-7 Period Pattern: Regular Menstrual Flow: Moderate, Heavy Menstrual Control: Maxi pad Dysmenorrhea: (!) Moderate     Sexually active: Yes. (Abstinent currently with partner) The current method of family planning is condoms .    Exercising: Yes.     running Smoker:  no  Health Maintenance: Pap:  12/06/21 neg: HR HPV neg, 07/29/20 LSIL: HR HPV positive History of abnormal Pap:  yes, 05-01-19 colpo revealed LGSIL;Pap 04-16-19 ASCUS:Pos HR HPV--no treatment. Had colpo age 45 with no treatment. MMG:  n/a Colonoscopy:  n/a BMD:   n/a  Result  n/a TDaP:  UTD Gardasil:   yes HIV: 12/06/21 NR Hep C: 12/06/21 NR Screening Labs:  today.   reports that she has never smoked. She has never used smokeless tobacco. She reports current alcohol use of about 2.0 standard drinks of alcohol per week. She reports that she does not currently use drugs after having used the following drugs: Marijuana.  Past Medical History:  Diagnosis Date   Abnormal uterine bleeding    Anxiety    CIN I (cervical intraepithelial neoplasia I) 2021   Depression    Dysmenorrhea    Elevated bilirubin    Fibroid uterus    Heart murmur    HSV-1 (herpes simplex virus 1) infection    Low vitamin D level     Past Surgical History:  Procedure Laterality Date   NO PAST SURGERIES      Current Outpatient Medications  Medication Sig Dispense Refill   ibuprofen (ADVIL) 800 MG tablet Take 1 tablet (800 mg  total) by mouth every 8 (eight) hours as needed for cramping. 60 tablet 0   Vitamin D, Ergocalciferol, (DRISDOL) 1.25 MG (50000 UNIT) CAPS capsule Take 1 capsule (50,000 Units total) by mouth every 7 (seven) days. 12 capsule 0   No current facility-administered medications for this visit.    Family History  Problem Relation Age of Onset   Hypertension Father     Review of Systems  All other systems reviewed and are negative.   Exam:   BP 122/80 (BP Location: Right Arm, Patient Position: Sitting, Cuff Size: Normal)   Pulse 81   Ht 5\' 9"  (1.753 m)   Wt 150 lb (68 kg)   LMP 12/02/2022   SpO2 99%   BMI 22.15 kg/m     General appearance: alert, cooperative and appears stated age Head: normocephalic, without obvious abnormality, atraumatic Neck: no adenopathy, supple, symmetrical, trachea midline and thyroid normal to inspection and palpation Lungs: clear to auscultation bilaterally Breasts: normal appearance, no masses or tenderness, No nipple retraction or dimpling, No nipple discharge or bleeding, No axillary adenopathy Heart: regular rate and rhythm Abdomen: soft, non-tender; no masses, no organomegaly Extremities: extremities normal, atraumatic, no cyanosis or edema Skin: skin color, texture, turgor normal. No rashes or lesions Lymph nodes: cervical, supraclavicular, and axillary nodes normal. Neurologic: grossly normal  Pelvic: External genitalia:  no  lesions              No abnormal inguinal nodes palpated.              Urethra:  normal appearing urethra with no masses, tenderness or lesions              Bartholins and Skenes: normal                 Vagina: normal appearing vagina with normal color and discharge, no lesions              Cervix: no lesions              Pap taken: yes Bimanual Exam:  Uterus:  normal size, contour, position, consistency, mobility, non-tender              Adnexa: no mass, fullness, tenderness              Chaperone was present for exam:   Warren Lacy, CMA  Assessment:   Well woman visit with gynecologic exam. Fibroids.  Partial submucous component.  Dysmenorrhea. Hx LGSIL.  Fatigue. HSV I.  STD screening.   Plan: Mammogram screening discussed. Self breast awareness reviewed. Pap and HR HPV collected. Guidelines for Calcium, Vitamin D, regular exercise program including cardiovascular and weight bearing exercise. Routine labs and STD screening.   Information about [facility] to patient.  She will let me know if she would like a prescription.  Follow up annually and prn.

## 2022-11-26 ENCOUNTER — Other Ambulatory Visit: Payer: Self-pay | Admitting: Radiology

## 2022-11-26 DIAGNOSIS — N946 Dysmenorrhea, unspecified: Secondary | ICD-10-CM

## 2022-11-28 ENCOUNTER — Other Ambulatory Visit (HOSPITAL_COMMUNITY): Payer: Self-pay

## 2022-11-28 MED ORDER — IBUPROFEN 800 MG PO TABS
800.0000 mg | ORAL_TABLET | Freq: Three times a day (TID) | ORAL | 0 refills | Status: DC | PRN
  Filled 2022-11-28: qty 60, 20d supply, fill #0

## 2022-11-28 NOTE — Telephone Encounter (Signed)
Med refill request: Ibuprofen Last AEX: 12/06/21 Next AEX: 12/08/22 Last MMG (if hormonal med) n/a Refill authorized: Please Advise, #60, 0 RF

## 2022-11-29 ENCOUNTER — Other Ambulatory Visit (HOSPITAL_COMMUNITY): Payer: Self-pay

## 2022-12-08 ENCOUNTER — Ambulatory Visit: Payer: BC Managed Care – PPO | Admitting: Obstetrics and Gynecology

## 2022-12-08 ENCOUNTER — Encounter: Payer: Self-pay | Admitting: Obstetrics and Gynecology

## 2022-12-08 ENCOUNTER — Other Ambulatory Visit (HOSPITAL_COMMUNITY)
Admission: RE | Admit: 2022-12-08 | Discharge: 2022-12-08 | Disposition: A | Discharge status: Home / Self Care | Payer: BC Managed Care – PPO | Source: Ambulatory Visit | Attending: Obstetrics and Gynecology | Admitting: Obstetrics and Gynecology

## 2022-12-08 VITALS — BP 122/80 | HR 81 | Ht 69.0 in | Wt 150.0 lb

## 2022-12-08 DIAGNOSIS — Z113 Encounter for screening for infections with a predominantly sexual mode of transmission: Secondary | ICD-10-CM | POA: Insufficient documentation

## 2022-12-08 DIAGNOSIS — Z87410 Personal history of cervical dysplasia: Secondary | ICD-10-CM

## 2022-12-08 DIAGNOSIS — Z1159 Encounter for screening for other viral diseases: Secondary | ICD-10-CM

## 2022-12-08 DIAGNOSIS — Z124 Encounter for screening for malignant neoplasm of cervix: Secondary | ICD-10-CM | POA: Diagnosis present

## 2022-12-08 DIAGNOSIS — Z01419 Encounter for gynecological examination (general) (routine) without abnormal findings: Secondary | ICD-10-CM

## 2022-12-08 DIAGNOSIS — Z5320 Procedure and treatment not carried out because of patient's decision for unspecified reasons: Secondary | ICD-10-CM

## 2022-12-08 DIAGNOSIS — R011 Cardiac murmur, unspecified: Secondary | ICD-10-CM | POA: Insufficient documentation

## 2022-12-08 DIAGNOSIS — Z0000 Encounter for general adult medical examination without abnormal findings: Secondary | ICD-10-CM

## 2022-12-08 DIAGNOSIS — R5383 Other fatigue: Secondary | ICD-10-CM | POA: Diagnosis not present

## 2022-12-08 NOTE — Patient Instructions (Signed)
EXERCISE AND DIET:  We recommended that you start or continue a regular exercise program for good health. Regular exercise means any activity that makes your heart beat faster and makes you sweat.  We recommend exercising at least 30 minutes per day at least 3 days a week, preferably 4 or 5.  We also recommend a diet low in fat and sugar.  Inactivity, poor dietary choices and obesity can cause diabetes, heart attack, stroke, and kidney damage, among others.    ALCOHOL AND SMOKING:  Women should limit their alcohol intake to no more than 7 drinks/beers/glasses of wine (combined, not each!) per week. Moderation of alcohol intake to this level decreases your risk of breast cancer and liver damage. And of course, no recreational drugs are part of a healthy lifestyle.  And absolutely no smoking or even second hand smoke. Most people know smoking can cause heart and lung diseases, but did you know it also contributes to weakening of your bones? Aging of your skin?  Yellowing of your teeth and nails?  CALCIUM AND VITAMIN D:  Adequate intake of calcium and Vitamin D are recommended.  The recommendations for exact amounts of these supplements seem to change often, but generally speaking 600 mg of calcium (either carbonate or citrate) and 800 units of Vitamin D per day seems prudent. Certain women may benefit from higher intake of Vitamin D.  If you are among these women, your doctor will have told you during your visit.    PAP SMEARS:  Pap smears, to check for cervical cancer or precancers,  have traditionally been done yearly, although recent scientific advances have shown that most women can have pap smears less often.  However, every woman still should have a physical exam from her gynecologist every year. It will include a breast check, inspection of the vulva and vagina to check for abnormal growths or skin changes, a visual exam of the cervix, and then an exam to evaluate the size and shape of the uterus and  ovaries.  And after 34 years of age, a rectal exam is indicated to check for rectal cancers. We will also provide age appropriate advice regarding health maintenance, like when you should have certain vaccines, screening for sexually transmitted diseases, bone density testing, colonoscopy, mammograms, etc.   MAMMOGRAMS:  All women over 30 years old should have a yearly mammogram. Many facilities now offer a "3D" mammogram, which may cost around $50 extra out of pocket. If possible,  we recommend you accept the option to have the 3D mammogram performed.  It both reduces the number of women who will be called back for extra views which then turn out to be normal, and it is better than the routine mammogram at detecting truly abnormal areas.    COLONOSCOPY:  Colonoscopy to screen for colon cancer is recommended for all women at age 7.  We know, you hate the idea of the prep.  We agree, BUT, having colon cancer and not knowing it is worse!!  Colon cancer so often starts as a polyp that can be seen and removed at colonscopy, which can quite literally save your life!  And if your first colonoscopy is normal and you have no family history of colon cancer, most women don't have to have it again for 10 years.  Once every ten years, you can do something that may end up saving your life, right?  We will be happy to help you get it scheduled when you are ready.  Be sure to check your insurance coverage so you understand how much it will cost.  It may be covered as a preventative service at no cost, but you should check your particular policy.      Ethinyl Estradiol; Norelgestromin Patches What is this medication? ETHINYL ESTRADIOL;NORELGESTROMIN (ETH in il es tra DYE ole; nor el JES troe min) prevents ovulation and pregnancy. It belongs to a group of medications called contraceptives. It is a combination of the hormones estrogen and progestin. This medicine may be used for other purposes; ask your health care  provider or pharmacist if you have questions. COMMON BRAND NAME(S): Ortho Christianne Borrow, ZAFEMY What should I tell my care team before I take this medication? They need to know if you have or ever had any of these conditions: Abnormal vaginal bleeding Blood clots Blood vessel disease Breast, cervical, endometrial, ovarian, liver, or uterine cancer Diabetes Gallbladder disease Having surgery Heart disease or recent heart attack High blood pressure High cholesterol or triglycerides History of irregular heartbeat or heart valve problems Kidney disease Liver disease Lupus Migraine headaches Protein C or S deficiency Recently had a baby, miscarriage, or abortion Stroke Tobacco use An unusual or allergic reaction to estrogens, progestins, other medications, foods, dyes, or preservatives Pregnant or trying to get pregnant Breastfeeding How should I use this medication? This patch is applied to the skin. Follow the directions on the prescription label. Apply to clean, dry, healthy skin on the buttock, abdomen, upper outer arm or upper torso, in a place where it will not be rubbed by tight clothing. Do not use lotions or other cosmetics on the site where the patch will go. Press the patch firmly in place for 10 seconds to ensure good contact with the skin. Change the patch every 7 days on the same day of the week for 3 weeks. You will then have a break from the patch for 1 week, after which you will apply a new patch. Do not use your medication more often than directed. Contact your care team about the use of this medication in children. Special care may be needed. This medication has been used in female children who have started having menstrual periods. A patient package insert for the product will be given with each prescription and refill. Read this sheet carefully each time. The sheet may change frequently. Overdosage: If you think you have taken too much of this medicine contact a poison  control center or emergency room at once. NOTE: This medicine is only for you. Do not share this medicine with others. What if I miss a dose? You will need to replace your patch once a week as directed. If your patch is lost or falls off, contact your care team for advice. You may need to use another form of birth control if your patch has been off for more than 1 day. What may interact with this medication? Do not take this medication with the following: Dasabuvir; ombitasvir; paritaprevir; ritonavir Ombitasvir; paritaprevir; ritonavir This medication may also interact with the following: Acetaminophen Antibiotics or medications for infections, especially rifampin, rifabutin, rifapentine, penicillins, or tetracyclines Aprepitant or fosaprepitant Armodafinil Ascorbic acid (vitamin C) Barbiturate medications, such as phenobarbital or primidone Bosentan Certain antivirals for HIV or hepatitis Certain medications for cancer treatment Certain medications for cholesterol Certain medications for seizures, such as carbamazepine, clobazam, felbamate, lamotrigine, oxcarbazepine, phenytoin, rufinamide, or topiramate Cyclosporine Dantrolene Elagolix Flibanserin Grapefruit juice Lesinurad Medications for diabetes Medications to treat fungal infections, such as griseofulvin, miconazole,  fluconazole, ketoconazole, itraconazole, posaconazole, or voriconazole Mifepristone Mitotane Modafinil Morphine Mycophenolate St. John's wort Tamoxifen Temazepam Theophylline or aminophylline Thyroid hormones Tizanidine Tranexamic acid Ulipristal Warfarin This list may not describe all possible interactions. Give your health care provider a list of all the medicines, herbs, non-prescription drugs, or dietary supplements you use. Also tell them if you smoke, drink alcohol, or use illegal drugs. Some items may interact with your medicine. What should I watch for while using this medication? Visit your  care team for regular checks on your progress. You will need a regular breast and pelvic exam and Pap smear while on this medication. Use an additional method of contraception during the first cycle that you use this patch. If you have any reason to think you are pregnant, stop using this medication right away and contact your care team. If you are using this medication for hormone related problems, it may take several cycles of use to see improvement in your condition. Smoking tobacco increases the risk of getting a blood clot or having a stroke while you are taking this medication, especially if you are older than 35 years. This medication can make your body retain fluid, making your fingers, hands, or ankles swell. Your blood pressure can go up. Contact your care team if you feel you are retaining fluid. This medication can make you more sensitive to the sun. Keep out of the sun. If you cannot avoid being in the sun, wear protective clothing and sunscreen. Do not use sun lamps, tanning beds, or tanning booths. If you wear contact lenses and notice visual changes, or if the lenses begin to feel uncomfortable, consult your eye care specialist. Tenderness, swelling, or minor bleeding of the gums may occur. Talk to your dentist if this happens. Brushing and flossing your teeth regularly may reduce the risk of side effects. Visit your dentist on a regular basis. Tell your dentist about any medications you are taking. If you are going to have elective surgery or an MRI, tell your care team that you are using this medication. You may need to remove the patch before the procedure. Using this medication does not protect you or your partner against HIV or other sexually transmitted infections (STIs). What side effects may I notice from receiving this medication? Side effects that you should report to your care team as soon as possible: Allergic reactions--skin rash, itching, hives, swelling of the face, lips,  tongue, or throat Blood clot--pain, swelling, or warmth in the leg, shortness of breath, chest pain Gallbladder problems--severe stomach pain, nausea, vomiting, fever Increase in blood pressure Liver injury--right upper belly pain, loss of appetite, nausea, light-colored stool, dark yellow or brown urine, yellowing skin or eyes, unusual weakness or fatigue New or worsening migraines or headaches Stroke--sudden numbness or weakness of the face, arm, or leg, trouble speaking, confusion, trouble walking, loss of balance or coordination, dizziness, severe headache, change in vision Unusual vaginal discharge, itching, or odor Worsening mood, feelings of depression Side effects that usually do not require medical attention (report to your care team if they continue or are bothersome): Breast pain or tenderness Dark patches of skin on the face or other sun-exposed areas Irregular menstrual cycles or spotting Nausea Weight gain This list may not describe all possible side effects. Call your doctor for medical advice about side effects. You may report side effects to FDA at 1-800-FDA-1088. Where should I keep my medication? Keep out of the reach of children and pets. Store at room temperature  between 15 and 30 degrees C (59 and 86 degrees F). Keep the patch in its pouch until time of use. Throw away any unused medication after the expiration date. Dispose of used patches properly. Since a used patch may still contain active hormones, fold the patch in half so that it sticks to itself prior to disposal. Throw away in a place where children or pets cannot reach. NOTE: This sheet is a summary. It may not cover all possible information. If you have questions about this medicine, talk to your doctor, pharmacist, or health care provider.  2024 Elsevier/Gold Standard (2021-10-05 00:00:00)

## 2022-12-10 LAB — COMPREHENSIVE METABOLIC PANEL
AG Ratio: 1.7 (calc) (ref 1.0–2.5)
ALT: 13 U/L (ref 6–29)
AST: 17 U/L (ref 10–30)
Albumin: 4.4 g/dL (ref 3.6–5.1)
Alkaline phosphatase (APISO): 58 U/L (ref 31–125)
BUN: 8 mg/dL (ref 7–25)
CO2: 23 mmol/L (ref 20–32)
Calcium: 9.2 mg/dL (ref 8.6–10.2)
Chloride: 106 mmol/L (ref 98–110)
Creat: 0.75 mg/dL (ref 0.50–0.97)
Globulin: 2.6 g/dL (ref 1.9–3.7)
Glucose, Bld: 84 mg/dL (ref 65–99)
Potassium: 4.1 mmol/L (ref 3.5–5.3)
Sodium: 138 mmol/L (ref 135–146)
Total Bilirubin: 1.3 mg/dL — ABNORMAL HIGH (ref 0.2–1.2)
Total Protein: 7 g/dL (ref 6.1–8.1)

## 2022-12-10 LAB — LIPID PANEL
Cholesterol: 140 mg/dL
HDL: 54 mg/dL
LDL Cholesterol (Calc): 73 mg/dL
Non-HDL Cholesterol (Calc): 86 mg/dL
Total CHOL/HDL Ratio: 2.6 (calc)
Triglycerides: 44 mg/dL

## 2022-12-10 LAB — CBC
HCT: 41 % (ref 35.0–45.0)
Hemoglobin: 13.8 g/dL (ref 11.7–15.5)
MCH: 34.6 pg — ABNORMAL HIGH (ref 27.0–33.0)
MCHC: 33.7 g/dL (ref 32.0–36.0)
MCV: 102.8 fL — ABNORMAL HIGH (ref 80.0–100.0)
MPV: 10.8 fL (ref 7.5–12.5)
Platelets: 238 10*3/uL (ref 140–400)
RBC: 3.99 Million/uL (ref 3.80–5.10)
RDW: 11.5 % (ref 11.0–15.0)
WBC: 5.5 10*3/uL (ref 3.8–10.8)

## 2022-12-10 LAB — HIV ANTIBODY (ROUTINE TESTING W REFLEX): HIV 1&2 Ab, 4th Generation: NONREACTIVE

## 2022-12-10 LAB — TSH: TSH: 2.18 m[IU]/L

## 2022-12-10 LAB — SYPHILIS: RPR W/REFLEX TO RPR TITER AND TREPONEMAL ANTIBODIES, TRADITIONAL SCREENING AND DIAGNOSIS ALGORITHM: RPR Ser Ql: NONREACTIVE

## 2022-12-10 LAB — HEPATITIS C ANTIBODY: Hepatitis C Ab: NONREACTIVE

## 2022-12-10 LAB — VITAMIN D 25 HYDROXY (VIT D DEFICIENCY, FRACTURES): Vit D, 25-Hydroxy: 31 ng/mL (ref 30–100)

## 2022-12-19 LAB — CYTOLOGY - PAP
Chlamydia: NEGATIVE
Comment: NEGATIVE
Comment: NEGATIVE
Comment: NEGATIVE
Comment: NORMAL
Diagnosis: NEGATIVE
High risk HPV: NEGATIVE
Neisseria Gonorrhea: NEGATIVE
Trichomonas: NEGATIVE

## 2023-02-06 ENCOUNTER — Other Ambulatory Visit (INDEPENDENT_AMBULATORY_CARE_PROVIDER_SITE_OTHER): Payer: Self-pay | Admitting: Family Medicine

## 2023-02-06 ENCOUNTER — Telehealth: Payer: BC Managed Care – PPO | Admitting: Family Medicine

## 2023-02-06 DIAGNOSIS — B3731 Acute candidiasis of vulva and vagina: Secondary | ICD-10-CM

## 2023-02-06 MED ORDER — FLUCONAZOLE 150 MG PO TABS: 150.0000 mg | ORAL_TABLET | Freq: Every day | ORAL | 0 refills | Status: DC

## 2023-02-06 NOTE — Progress Notes (Signed)

## 2023-02-06 NOTE — Addendum Note (Signed)
Addended by: Freddy Finner on: 02/06/2023 09:55 AM   Modules accepted: Orders

## 2023-03-10 ENCOUNTER — Other Ambulatory Visit (INDEPENDENT_AMBULATORY_CARE_PROVIDER_SITE_OTHER): Payer: Self-pay | Admitting: Family Medicine

## 2023-03-10 ENCOUNTER — Telehealth: Payer: BC Managed Care – PPO | Admitting: Family Medicine

## 2023-03-10 DIAGNOSIS — B3731 Acute candidiasis of vulva and vagina: Secondary | ICD-10-CM

## 2023-03-10 MED ORDER — FLUCONAZOLE 150 MG PO TABS: 150.0000 mg | ORAL_TABLET | Freq: Once | ORAL | 0 refills | Status: AC

## 2023-03-10 NOTE — Progress Notes (Signed)

## 2023-03-22 NOTE — Progress Notes (Deleted)
 GYNECOLOGY  VISIT   HPI: 33 y.o.   Single  African American female   [id number] with No LMP recorded.   here for: labs and vaginal inf??     GYNECOLOGIC HISTORY: No LMP recorded. Contraception:  condoms Menopausal hormone therapy:  n/a Last 2 paps:  12/08/22 neg: HR HPV neg, 12/06/21 neg: HR HPV neg History of abnormal Pap or positive HPV:  yes Mammogram:  n/a        OB History     Gravida  0   Para  0   Term  0   Preterm  0   AB  0   Living  0      SAB  0   IAB  0   Ectopic  0   Multiple  0   Live Births  0              Patient Active Problem List   Diagnosis Date Noted   Heart murmur 12/08/2022   Fibroids, submucosal 05/03/2018   Menorrhagia with regular cycle 05/03/2018    Past Medical History:  Diagnosis Date   Abnormal uterine bleeding    Anxiety    CIN I (cervical intraepithelial neoplasia I) 2021   Depression    Dysmenorrhea    Elevated bilirubin    Fibroid uterus    Heart murmur    HSV-1 (herpes simplex virus 1) infection    Low vitamin D level     Past Surgical History:  Procedure Laterality Date   NO PAST SURGERIES      Current Outpatient Medications  Medication Sig Dispense Refill   fluconazole (DIFLUCAN) 150 MG tablet Take 1 tablet (150 mg total) by mouth daily. 1 tablet 0   fluconazole (DIFLUCAN) 150 MG tablet Take 1 tablet (150 mg total) by mouth daily. 1 tablet 0   ibuprofen (ADVIL) 800 MG tablet Take 1 tablet (800 mg total) by mouth every 8 (eight) hours as needed for cramping. 60 tablet 0   Vitamin D, Ergocalciferol, (DRISDOL) 1.25 MG (50000 UNIT) CAPS capsule Take 1 capsule (50,000 Units total) by mouth every 7 (seven) days. 12 capsule 0   No current facility-administered medications for this visit.     ALLERGIES: Lactose intolerance (gi)  Family History  Problem Relation Age of Onset   Hypertension Father     Social History   Socioeconomic History   Marital status: Single    Spouse name: Not on file    Number of children: Not on file   Years of education: Not on file   Highest education level: Not on file  Occupational History   Not on file  Tobacco Use   Smoking status: Never   Smokeless tobacco: Never  Vaping Use   Vaping status: Never Used  Substance and Sexual Activity   Alcohol use: Yes    Alcohol/week: 2.0 standard drinks of alcohol    Types: 2 Glasses of wine per week    Comment: occ glass of wine   Drug use: Not Currently    Types: Marijuana    Comment: occ.   Sexual activity: Not Currently    Birth control/protection: Condom    Comment: condoms sometimes  Other Topics Concern   Not on file  Social History Narrative   Not on file   Social Drivers of Health   Financial Resource Strain: Not on file  Food Insecurity: Not on file  Transportation Needs: Not on file  Physical Activity: Not on file  Stress:  Not on file  Social Connections: Unknown (07/27/2021)   Received from [facility]   Social Network    Social Network: Not on file  Intimate Partner Violence: Unknown (06/18/2021)   Received from [facility]   HITS    Physically Hurt: Not on file    Insult or Talk Down To: Not on file    Threaten Physical Harm: Not on file    Scream or Curse: Not on file    Review of Systems  PHYSICAL EXAMINATION:   There were no vitals taken for this visit.    General appearance: alert, cooperative and appears stated age Head: Normocephalic, without obvious abnormality, atraumatic Neck: no adenopathy, supple, symmetrical, trachea midline and thyroid normal to inspection and palpation Lungs: clear to auscultation bilaterally Breasts: normal appearance, no masses or tenderness, No nipple retraction or dimpling, No nipple discharge or bleeding, No axillary or supraclavicular adenopathy Heart: regular rate and rhythm Abdomen: soft, non-tender, no masses,  no organomegaly Extremities: extremities normal, atraumatic, no cyanosis or edema Skin:  Skin color, texture, turgor normal. No rashes or lesions Lymph nodes: Cervical, supraclavicular, and axillary nodes normal. No abnormal inguinal nodes palpated Neurologic: Grossly normal  Pelvic: External genitalia:  no lesions              Urethra:  normal appearing urethra with no masses, tenderness or lesions              Bartholins and Skenes: normal                 Vagina: normal appearing vagina with normal color and discharge, no lesions              Cervix: no lesions                Bimanual Exam:  Uterus:  normal size, contour, position, consistency, mobility, non-tender              Adnexa: no mass, fullness, tenderness              Rectal exam: {yes no:314532}.  Confirms.              Anus:  normal sphincter tone, no lesions  Chaperone was present for exam:  {BSCHAPERONE:31226::"Tayshaun Kroh F, CMA"}  ASSESSMENT:    PLAN:    {LABS (Optional):23779}  ***  total time was spent for this patient encounter, including preparation, face-to-face counseling with the patient, coordination of care, and documentation of the encounter.

## 2023-03-27 ENCOUNTER — Other Ambulatory Visit (INDEPENDENT_AMBULATORY_CARE_PROVIDER_SITE_OTHER): Payer: Self-pay | Admitting: Family Medicine

## 2023-03-27 ENCOUNTER — Telehealth: Payer: BC Managed Care – PPO | Admitting: Physician Assistant

## 2023-03-27 DIAGNOSIS — B3731 Acute candidiasis of vulva and vagina: Secondary | ICD-10-CM

## 2023-03-27 DIAGNOSIS — N760 Acute vaginitis: Secondary | ICD-10-CM

## 2023-03-27 MED ORDER — FLUCONAZOLE 150 MG PO TABS: 150.0000 mg | ORAL_TABLET | Freq: Once | ORAL | 0 refills | Status: AC

## 2023-03-27 NOTE — Progress Notes (Signed)
 Because you have had recurrent symptoms over the last 2 months, I feel your condition warrants further evaluation and I recommend that you be seen in a face to face visit for and evaluation and formal testing to determine the cause.   NOTE: There will be NO CHARGE for this E-Visit   If you are having a true medical emergency please call 911.     For an urgent face to face visit, [facility] has multiple urgent care centers for your convenience.   [facility] at [facility] Your location to [facility] Urgent Care at [facility] [phone] [address] [place], [place] [address]   Urgent Care at [facility] [place], [place]  [place] Your location to [facility] Urgent Care at [facility] [phone] [address] [place],  [place]  [address]  [facility] Urgent Care at [facility]) Get Driving Directions [phone] [address] [place], [address]    [facility] Urgent Care Center at [facility] Get Driving Directions [phone] [address] [place], [place] [address]   [facility] Urgent [facility]) Get Driving Directions [phone] [address] [place], [place] [address]  [facility] Urgent Care Center [facility]) Get Driving Directions [phone] [address] [place],  [place]  [address]  [facility] Urgent Care Center [facility] - at [employer]  [phone] [address] [place],  [place] [address]   [facility] Urgent Care at [facility] Get Driving Directions [phone] [place], [place] [address]   [facility] Health Urgent Care at [facility] Get Driving Directions  [phone] [address] [place], [place] [address]   [facility] Urgent Care at  [facility] Directions [phone] [address] [place], [place] [address]  Your MyChart E-visit questionnaire answers were reviewed by a board certified advanced clinical practitioner to complete your personal care plan based on your specific symptoms.  Thank you for using e-Visits.     I have spent 5 minutes in review of e-visit questionnaire, review and updating patient chart, medical decision making and response to patient.   Delon CHRISTELLA Dickinson, PA-C

## 2023-03-27 NOTE — Addendum Note (Signed)
 Addended by: Margaretann Loveless on: 03/27/2023 07:43 PM   Modules accepted: Orders, Level of Service

## 2023-03-27 NOTE — Progress Notes (Signed)

## 2023-04-05 ENCOUNTER — Encounter: Payer: Self-pay | Admitting: Obstetrics and Gynecology

## 2023-04-05 ENCOUNTER — Ambulatory Visit (INDEPENDENT_AMBULATORY_CARE_PROVIDER_SITE_OTHER): Payer: BC Managed Care – PPO | Admitting: Obstetrics and Gynecology

## 2023-04-05 ENCOUNTER — Other Ambulatory Visit (HOSPITAL_COMMUNITY)
Admission: RE | Admit: 2023-04-05 | Discharge: 2023-04-05 | Disposition: A | Discharge status: Home / Self Care | Payer: BC Managed Care – PPO | Source: Ambulatory Visit | Attending: Obstetrics and Gynecology | Admitting: Obstetrics and Gynecology

## 2023-04-05 ENCOUNTER — Ambulatory Visit: Payer: BC Managed Care – PPO | Admitting: Obstetrics and Gynecology

## 2023-04-05 VITALS — BP 126/78 | HR 82 | Ht 69.0 in | Wt 149.0 lb

## 2023-04-05 DIAGNOSIS — D219 Benign neoplasm of connective and other soft tissue, unspecified: Secondary | ICD-10-CM

## 2023-04-05 DIAGNOSIS — F439 Reaction to severe stress, unspecified: Secondary | ICD-10-CM | POA: Diagnosis not present

## 2023-04-05 DIAGNOSIS — N898 Other specified noninflammatory disorders of vagina: Secondary | ICD-10-CM | POA: Diagnosis present

## 2023-04-05 DIAGNOSIS — N946 Dysmenorrhea, unspecified: Secondary | ICD-10-CM | POA: Diagnosis not present

## 2023-04-05 LAB — WET PREP FOR TRICH, YEAST, CLUE

## 2023-04-05 MED ORDER — IBUPROFEN 800 MG PO TABS: 800.0000 mg | ORAL_TABLET | Freq: Three times a day (TID) | ORAL | 0 refills | Status: DC | PRN

## 2023-04-05 NOTE — Progress Notes (Signed)
GYNECOLOGY  VISIT   HPI: 33 y.o.   Single  African American female   G0P0000 with Patient's last menstrual period was 03/15/2023.   here for:  labs and vaginal infection --reported having discharge, itching and burning.    Slight odor.   Treated with Diflucan for yeast infection in December and this month.   No recent antibiotic use.   Worried about her fibroids.  She is asking about potential removal of the fibroids.   Menses every month, last 5 - 7 days.   Uses Motrin for pain control. Needs refill.   Thinks she may have allergies.  Job stress.  Works as an TEFL teacher. Engaged.  Marrying in June.  GYNECOLOGIC HISTORY: Patient's last menstrual period was 03/15/2023. Contraception:  condoms Menopausal hormone therapy:  n/a Last 2 paps:  12/08/22 neg: HR HPV neg, 12/06/21 neg: HR HPV neg History of abnormal Pap or positive HPV:  yes Mammogram:  n/a        OB History     Gravida  0   Para  0   Term  0   Preterm  0   AB  0   Living  0      SAB  0   IAB  0   Ectopic  0   Multiple  0   Live Births  0              Patient Active Problem List   Diagnosis Date Noted   Heart murmur 12/08/2022   Fibroids, submucosal 05/03/2018   Menorrhagia with regular cycle 05/03/2018    Past Medical History:  Diagnosis Date   Abnormal uterine bleeding    Anxiety    CIN I (cervical intraepithelial neoplasia I) 2021   Depression    Dysmenorrhea    Elevated bilirubin    Fibroid uterus    Heart murmur    HSV-1 (herpes simplex virus 1) infection    Low vitamin D level     Past Surgical History:  Procedure Laterality Date   NO PAST SURGERIES      Current Outpatient Medications  Medication Sig Dispense Refill   ibuprofen (ADVIL) 800 MG tablet Take 1 tablet (800 mg total) by mouth every 8 (eight) hours as needed for cramping. 60 tablet 0   Vitamin D, Ergocalciferol, (DRISDOL) 1.25 MG (50000 UNIT) CAPS capsule Take 1 capsule (50,000 Units total) by  mouth every 7 (seven) days. (Patient not taking: Reported on 04/05/2023) 12 capsule 0   No current facility-administered medications for this visit.     ALLERGIES: Lactose intolerance (gi)  Family History  Problem Relation Age of Onset   Hypertension Father     Social History   Socioeconomic History   Marital status: Single    Spouse name: Not on file   Number of children: Not on file   Years of education: Not on file   Highest education level: Not on file  Occupational History   Not on file  Tobacco Use   Smoking status: Never   Smokeless tobacco: Never  Vaping Use   Vaping status: Never Used  Substance and Sexual Activity   Alcohol use: Yes    Alcohol/week: 2.0 standard drinks of alcohol    Types: 2 Glasses of wine per week    Comment: occ glass of wine   Drug use: Not Currently    Types: Marijuana    Comment: occ.   Sexual activity: Yes    Birth control/protection: Condom  Comment: condoms sometimes  Other Topics Concern   Not on file  Social History Narrative   Not on file   Social Drivers of Health   Financial Resource Strain: Not on file  Food Insecurity: Not on file  Transportation Needs: Not on file  Physical Activity: Not on file  Stress: Not on file  Social Connections: Unknown (07/27/2021)   Received from [facility], [facility]   Social Network    Social Network: Not on file  Intimate Partner Violence: Unknown (06/18/2021)   Received from [facility], [facility]   HITS    Physically Hurt: Not on file    Insult or Talk Down To: Not on file    Threaten Physical Harm: Not on file    Scream or Curse: Not on file    Review of Systems  Genitourinary:  Positive for vaginal discharge.    PHYSICAL EXAMINATION:   BP 126/78 (BP Location: Left Arm, Patient Position: Sitting, Cuff Size: Small)   Pulse 82   Ht 5\' 9"  (1.753 m)   Wt 149 lb (67.6 kg)   LMP 03/15/2023   SpO2 100%   BMI 22.00 kg/m     General appearance: alert, cooperative  and appears stated age  Pelvic: External genitalia:  no lesions              Urethra:  normal appearing urethra with no masses, tenderness or lesions              Bartholins and Skenes: normal                 Vagina: normal appearing vagina with normal color and discharge, no lesions              Cervix: no lesions                Bimanual Exam:  Uterus:  normal size, contour, position, consistency, mobility, non-tender              Adnexa: no mass, fullness, tenderness          Chaperone was present for exam:  Warren Lacy, CMA  ASSESSMENT:  Vaginal discharge.  Fibroids. Dysmenorrhea.  Job stress.   PLAN:  Wet prep:  negative.  Nuswab testing for GC/CT/trich/BV/yeast.  Motrin 800 mg po q 8 hours prn. We discussed risks and benefits of fibroid removal.  Routine myomectomy is not recommended unless fibroids are symptomatic or affect fertility.  She will establish care with a PCP.  We talked about her getting a life coach.  Return for pelvic US.   30 min  total time was spent for this patient encounter, including preparation, face-to-face counseling with the patient, coordination of care, and documentation of the encounter.

## 2023-04-06 LAB — CERVICOVAGINAL ANCILLARY ONLY
Bacterial Vaginitis (gardnerella): NEGATIVE
Candida Glabrata: NEGATIVE
Candida Vaginitis: NEGATIVE
Chlamydia: NEGATIVE
Comment: NEGATIVE
Comment: NEGATIVE
Comment: NEGATIVE
Comment: NEGATIVE
Comment: NEGATIVE
Comment: NORMAL
Neisseria Gonorrhea: NEGATIVE
Trichomonas: NEGATIVE

## 2023-04-10 ENCOUNTER — Encounter: Payer: Self-pay | Admitting: Obstetrics and Gynecology

## 2023-04-25 NOTE — Progress Notes (Deleted)
 GYNECOLOGY  VISIT   HPI: 33 y.o.   Single  African American female   G0P0000 with No LMP recorded.   here for: U/S consult     GYNECOLOGIC HISTORY: No LMP recorded. Contraception:  condoms Menopausal hormone therapy:  n/a Last 2 paps:  12/08/22 neg: HR HPV neg, 12/06/21 neg: HR HPV neg  History of abnormal Pap or positive HPV:  yes Mammogram:  n/a        OB History     Gravida  0   Para  0   Term  0   Preterm  0   AB  0   Living  0      SAB  0   IAB  0   Ectopic  0   Multiple  0   Live Births  0              Patient Active Problem List   Diagnosis Date Noted   Heart murmur 12/08/2022   Fibroids, submucosal 05/03/2018   Menorrhagia with regular cycle 05/03/2018    Past Medical History:  Diagnosis Date   Abnormal uterine bleeding    Anxiety    CIN I (cervical intraepithelial neoplasia I) 2021   Depression    Dysmenorrhea    Elevated bilirubin    Fibroid uterus    Heart murmur    HSV-1 (herpes simplex virus 1) infection    Low vitamin D level     Past Surgical History:  Procedure Laterality Date   NO PAST SURGERIES      Current Outpatient Medications  Medication Sig Dispense Refill   ibuprofen (ADVIL) 800 MG tablet Take 1 tablet (800 mg total) by mouth every 8 (eight) hours as needed for cramping. 60 tablet 0   Vitamin D, Ergocalciferol, (DRISDOL) 1.25 MG (50000 UNIT) CAPS capsule Take 1 capsule (50,000 Units total) by mouth every 7 (seven) days. (Patient not taking: Reported on 04/05/2023) 12 capsule 0   No current facility-administered medications for this visit.     ALLERGIES: Lactose intolerance (gi)  Family History  Problem Relation Age of Onset   Hypertension Father     Social History   Socioeconomic History   Marital status: Single    Spouse name: Not on file   Number of children: Not on file   Years of education: Not on file   Highest education level: Not on file  Occupational History   Not on file  Tobacco Use    Smoking status: Never   Smokeless tobacco: Never  Vaping Use   Vaping status: Never Used  Substance and Sexual Activity   Alcohol use: Yes    Alcohol/week: 2.0 standard drinks of alcohol    Types: 2 Glasses of wine per week    Comment: occ glass of wine   Drug use: Not Currently    Types: Marijuana    Comment: occ.   Sexual activity: Yes    Birth control/protection: Condom    Comment: condoms sometimes  Other Topics Concern   Not on file  Social History Narrative   Not on file   Social Drivers of Health   Financial Resource Strain: Not on file  Food Insecurity: Not on file  Transportation Needs: Not on file  Physical Activity: Not on file  Stress: Not on file  Social Connections: Unknown (07/27/2021)   Received from [facility], [facility]   Social Network    Social Network: Not on file  Intimate Partner Violence: Unknown (06/18/2021)  Received from [facility], [facility]   HITS    Physically Hurt: Not on file    Insult or Talk Down To: Not on file    Threaten Physical Harm: Not on file    Scream or Curse: Not on file    Review of Systems  PHYSICAL EXAMINATION:   There were no vitals taken for this visit.    General appearance: alert, cooperative and appears stated age Head: Normocephalic, without obvious abnormality, atraumatic Neck: no adenopathy, supple, symmetrical, trachea midline and thyroid normal to inspection and palpation Lungs: clear to auscultation bilaterally Breasts: normal appearance, no masses or tenderness, No nipple retraction or dimpling, No nipple discharge or bleeding, No axillary or supraclavicular adenopathy Heart: regular rate and rhythm Abdomen: soft, non-tender, no masses,  no organomegaly Extremities: extremities normal, atraumatic, no cyanosis or edema Skin: Skin color, texture, turgor normal. No rashes or lesions Lymph nodes: Cervical, supraclavicular, and axillary nodes normal. No abnormal inguinal nodes  palpated Neurologic: Grossly normal  Pelvic: External genitalia:  no lesions              Urethra:  normal appearing urethra with no masses, tenderness or lesions              Bartholins and Skenes: normal                 Vagina: normal appearing vagina with normal color and discharge, no lesions              Cervix: no lesions                Bimanual Exam:  Uterus:  normal size, contour, position, consistency, mobility, non-tender              Adnexa: no mass, fullness, tenderness              Rectal exam: {yes no:314532}.  Confirms.              Anus:  normal sphincter tone, no lesions  Chaperone was present for exam:  {BSCHAPERONE:31226::"Levita Monical F, CMA"}  ASSESSMENT:    PLAN:    {LABS (Optional):23779}  ***  total time was spent for this patient encounter, including preparation, face-to-face counseling with the patient, coordination of care, and documentation of the encounter.

## 2023-05-09 ENCOUNTER — Other Ambulatory Visit: Payer: BC Managed Care – PPO

## 2023-05-09 ENCOUNTER — Other Ambulatory Visit: Payer: BC Managed Care – PPO | Admitting: Obstetrics and Gynecology

## 2023-05-24 NOTE — Progress Notes (Unsigned)
 GYNECOLOGY  VISIT   HPI: 33 y.o.   Single  African American female   G0P0000 with Patient's last menstrual period was 05/06/2023 (exact date).   here for: PUS for fibroids. Pt requesting pain w/ last period was excessive and requests refill on ibuprofen.  Pain returned with her last menstrual period.  Menstrual periods are regular and last 7 days.   Uses ibuprofen and heat.   Had irregular bleeding and nausea on birth control pills.   Planning an August wedding.   GYNECOLOGIC HISTORY: Patient's last menstrual period was 05/06/2023 (exact date). Contraception: condoms sometimes Menopausal hormone therapy: n/a Last 2 paps: 2023-WNL, HPV- neg, 11/2022-WNL, HPV- neg History of abnormal Pap or positive HPV: yes, 05-01-19 colpo revealed LGSIL;Pap 04-16-19 ASCUS:Pos HR HPV--no treatment. Had colpo age 49 with no treatment  Mammogram: n/a        OB History     Gravida  0   Para  0   Term  0   Preterm  0   AB  0   Living  0      SAB  0   IAB  0   Ectopic  0   Multiple  0   Live Births  0              Patient Active Problem List   Diagnosis Date Noted   Heart murmur 12/08/2022   Fibroids, submucosal 05/03/2018   Menorrhagia with regular cycle 05/03/2018    Past Medical History:  Diagnosis Date   Abnormal uterine bleeding    Anxiety    CIN I (cervical intraepithelial neoplasia I) 2021   Depression    Dysmenorrhea    Elevated bilirubin    Fibroid uterus    Heart murmur    HSV-1 (herpes simplex virus 1) infection    Low vitamin D level     Past Surgical History:  Procedure Laterality Date   NO PAST SURGERIES      Current Outpatient Medications  Medication Sig Dispense Refill   Cetirizine HCl (ZYRTEC PO) Take by mouth.     ibuprofen (ADVIL) 800 MG tablet Take 1 tablet (800 mg total) by mouth every 8 (eight) hours as needed for cramping. 60 tablet 0   No current facility-administered medications for this visit.     ALLERGIES: Lactose  intolerance (gi)  Family History  Problem Relation Age of Onset   Hypertension Father     Social History   Socioeconomic History   Marital status: Single    Spouse name: Not on file   Number of children: Not on file   Years of education: Not on file   Highest education level: Not on file  Occupational History   Not on file  Tobacco Use   Smoking status: Never   Smokeless tobacco: Never  Vaping Use   Vaping status: Never Used  Substance and Sexual Activity   Alcohol use: Yes    Alcohol/week: 2.0 standard drinks of alcohol    Types: 2 Glasses of wine per week    Comment: occ glass of wine   Drug use: Not Currently    Types: Marijuana    Comment: occ.   Sexual activity: Yes    Birth control/protection: Condom    Comment: condoms sometimes  Other Topics Concern   Not on file  Social History Narrative   Not on file   Social Drivers of Health   Financial Resource Strain: Not on file  Food Insecurity: Not on file  Transportation Needs: Not on file  Physical Activity: Not on file  Stress: Not on file  Social Connections: Unknown (07/27/2021)   Received from [facility], [facility]   Social Network    Social Network: Not on file  Intimate Partner Violence: Unknown (06/18/2021)   Received from [facility], [facility]   HITS    Physically Hurt: Not on file    Insult or Talk Down To: Not on file    Threaten Physical Harm: Not on file    Scream or Curse: Not on file    Review of Systems  All other systems reviewed and are negative.   PHYSICAL EXAMINATION:   BP 110/64   Pulse 80   LMP 05/06/2023 (Exact Date)   SpO2 98%     General appearance: alert, cooperative and appears stated age   Pelvic US uterus 7.91 x 5.97 x 5.15 cm.  Fibroids intramural and subserosal:  1.04 cm, 1.14 cm, 0.63 cm, 1.24 cm, 1.66 cm, 1.00 cm, 2.98 cm,  2.51 cm. (Degenerating).  EMS 7.55 mm, thin, symmetrical.  Left ovary 2.49 x 1.47 x 1.67 cm.  Right ovary 4.35 x 3.9 x 1.80 cm.    19 x 15 mm resolving cyst, 5.6 x 4.6 cm echogenic area previously seen as a 4 mm echogenic area in 2020. No adnexal masses.  No free fluid.      ASSESSMENT:    PLAN:    {LABS (Optional):23779}  ***  total time was spent for this patient encounter, including preparation, face-to-face counseling with the patient, coordination of care, and documentation of the encounter.

## 2023-05-25 ENCOUNTER — Ambulatory Visit (INDEPENDENT_AMBULATORY_CARE_PROVIDER_SITE_OTHER): Payer: BC Managed Care – PPO

## 2023-05-25 ENCOUNTER — Ambulatory Visit (INDEPENDENT_AMBULATORY_CARE_PROVIDER_SITE_OTHER): Payer: BC Managed Care – PPO | Admitting: Obstetrics and Gynecology

## 2023-05-25 ENCOUNTER — Encounter: Payer: Self-pay | Admitting: Obstetrics and Gynecology

## 2023-05-25 VITALS — BP 110/64 | HR 80

## 2023-05-25 DIAGNOSIS — N83201 Unspecified ovarian cyst, right side: Secondary | ICD-10-CM

## 2023-05-25 DIAGNOSIS — N946 Dysmenorrhea, unspecified: Secondary | ICD-10-CM

## 2023-05-25 DIAGNOSIS — D219 Benign neoplasm of connective and other soft tissue, unspecified: Secondary | ICD-10-CM

## 2023-05-25 MED ORDER — SLYND 4 MG PO TABS: 4.0000 mg | ORAL_TABLET | Freq: Every day | ORAL | 6 refills | Status: DC

## 2023-05-25 MED ORDER — IBUPROFEN 800 MG PO TABS: 800.0000 mg | ORAL_TABLET | Freq: Three times a day (TID) | ORAL | 0 refills | Status: DC | PRN

## 2023-05-25 NOTE — Patient Instructions (Signed)
 Drospirenone Tablets What is this medication? DROSPIRENONE (dro SPY re nown) prevents ovulation and pregnancy. It belongs to a group of medications called contraceptives. This medication is a progestin hormone. This medicine may be used for other purposes; ask your health care provider or pharmacist if you have questions. COMMON BRAND NAME(S): Slynd What should I tell my care team before I take this medication? They need to know if you have any of these conditions: Abnormal vaginal bleeding Adrenal gland disease Blood vessel disease or blood clots Breast, cervical, endometrial, ovarian, liver, or uterine cancer Depression Diabetes Heart disease or recent heart attack High potassium level Kidney disease Liver disease Migraine headaches Stroke An unusual or allergic reaction to drospirenone, progestins, or other medications, foods, dyes, or preservatives Pregnant or trying to get pregnant Breast-feeding How should I use this medication? Take this medication by mouth. To reduce nausea, this medication may be taken with food. Follow the directions on the prescription label. Take this medication at the same time each day and in the order directed on the package. Do not take your medication more often than directed. A patient package insert for the product will be given with each prescription and refill. Read this sheet carefully each time. The sheet may change frequently. Talk to your care team about the use of this medication in children. Special care may be needed. Overdosage: If you think you have taken too much of this medicine contact a poison control center or emergency room at once. NOTE: This medicine is only for you. Do not share this medicine with others. What if I miss a dose? If you miss a dose, take it as soon as you can and refer to the patient information sheet you received with your medication for direction. If you miss more than one pill, this medication may not be as  effective, and you may need to use another form of contraception. What may interact with this medication? Do not take this medication with any of the following: Atazanavir; cobicistat Bosentan Fosamprenavir This medication may also interact with the following: Aprepitant Barbiturates, such as phenobarbital, primidone Carbamazepine Certain antibiotics, such as clarithromycin, rifampin, rifabutin, rifapentine Certain antivirals for HIV or hepatitis Certain diuretics, such as amiloride, spironolactone, triamterene Certain medications for blood pressure, heart disease Certain medications for fungal infections, such as griseofulvin, ketoconazole, itraconazole, voriconazole Cyclosporine Felbamate Heparin Medications for diabetes Modafinil NSAIDs, medications for pain and inflammation, such as ibuprofen or naproxen Oxcarbazepine Phenytoin Potassium supplements Rufinamide St. John's Wort Topiramate This list may not describe all possible interactions. Give your health care provider a list of all the medicines, herbs, non-prescription drugs, or dietary supplements you use. Also tell them if you smoke, drink alcohol, or use illegal drugs. Some items may interact with your medicine. What should I watch for while using this medication? Visit your care team for regular checks on your progress. You will need a regular breast and pelvic exam and Pap smear while on this medication. You may need blood work done while you are taking this medication. If you have any reason to think you are pregnant, stop taking this medication right away and contact your care team. This medication does not protect you against HIV or any other sexually transmitted infections (STIs). If you are going to have elective surgery, you may need to stop taking this medication before the surgery. Consult your care team for advice. What side effects may I notice from receiving this medication? Side effects that you should report  to  your care team as soon as possible: Allergic reactions--skin rash, itching, hives, swelling of the face, lips, tongue, or throat Blood clot--pain, swelling, or warmth in the leg, shortness of breath, chest pain Heart attack--pain or tightness in the chest, shoulders, arms, or jaw, nausea, shortness of breath, cold or clammy skin, feeling faint or lightheaded High potassium level--muscle weakness, fast or irregular heartbeat Liver injury--right upper belly pain, loss of appetite, nausea, light-colored stool, dark yellow or brown urine, yellowing skin or eyes, unusual weakness or fatigue Stroke--sudden numbness or weakness of the face, arm, or leg, trouble speaking, confusion, trouble walking, loss of balance or coordination, dizziness, severe headache, change in vision Worsening mood, feelings of depression Side effects that usually do not require medical attention (report to your care team if they continue or are bothersome): Acne Breast pain or tenderness Headache Irregular menstrual cycles or spotting Menstrual cramps Nausea Weight gain This list may not describe all possible side effects. Call your doctor for medical advice about side effects. You may report side effects to FDA at 1-800-FDA-1088. Where should I keep my medication? Keep out of the reach of children and pets. Store at room temperature between 20 and 25 degrees C (68 and 77 degrees F). Throw away any unused medication after the expiration date. NOTE: This sheet is a summary. It may not cover all possible information. If you have questions about this medicine, talk to your doctor, pharmacist, or health care provider.  2024 Elsevier/Gold Standard (2021-01-07 00:00:00)

## 2023-06-02 ENCOUNTER — Encounter: Payer: Self-pay | Admitting: Obstetrics and Gynecology

## 2023-06-05 NOTE — Telephone Encounter (Signed)
 Routing to Dr. Edward Jolly to review when she returns.    Last AEX 12/08/22

## 2023-06-28 ENCOUNTER — Other Ambulatory Visit: Payer: Self-pay

## 2023-06-28 MED ORDER — SLYND 4 MG PO TABS: 4.0000 mg | ORAL_TABLET | Freq: Every day | ORAL | 4 refills | Status: DC

## 2023-06-28 NOTE — Telephone Encounter (Signed)
 Medication refill request: slynd  Last AEX:  12/08/22  Next AEX: not scheduled  Last MMG (if hormonal medication request): n/a Refill authorized: patient request via mychart.

## 2023-06-28 NOTE — Telephone Encounter (Signed)
 Refill request sent to provider.

## 2023-07-06 ENCOUNTER — Telehealth: Admitting: Physician Assistant

## 2023-07-06 DIAGNOSIS — B9689 Other specified bacterial agents as the cause of diseases classified elsewhere: Secondary | ICD-10-CM

## 2023-07-06 DIAGNOSIS — N760 Acute vaginitis: Secondary | ICD-10-CM

## 2023-07-06 MED ORDER — METRONIDAZOLE 500 MG PO TABS: 500.0000 mg | ORAL_TABLET | Freq: Two times a day (BID) | ORAL | 0 refills | Status: DC

## 2023-07-06 MED ORDER — FLUCONAZOLE 150 MG PO TABS: ORAL_TABLET | ORAL | 0 refills | Status: DC

## 2023-07-06 NOTE — Addendum Note (Signed)
 Addended by: Farris Hong on: 07/06/2023 11:33 AM   Modules accepted: Orders

## 2023-07-06 NOTE — Progress Notes (Signed)

## 2023-07-06 NOTE — Progress Notes (Signed)
 I have spent 5 minutes in review of e-visit questionnaire, review and updating patient chart, medical decision making and response to patient.   Piedad Climes, PA-C

## 2023-07-25 ENCOUNTER — Encounter: Payer: Self-pay | Admitting: Obstetrics and Gynecology

## 2023-08-18 ENCOUNTER — Encounter: Payer: Self-pay | Admitting: Obstetrics and Gynecology

## 2023-08-18 DIAGNOSIS — N946 Dysmenorrhea, unspecified: Secondary | ICD-10-CM

## 2023-08-21 MED ORDER — IBUPROFEN 800 MG PO TABS: 800.0000 mg | ORAL_TABLET | Freq: Three times a day (TID) | ORAL | 0 refills | Status: DC | PRN

## 2023-08-21 NOTE — Telephone Encounter (Signed)
 Med refill request: Ibuprofen  800mg  Last AEX: 12/08/2022-BS Next AEX: nothing scheduled, recall placed. Will advise the pt after decision on refill to schedule her 3 month recheck appt per her last OV note.  Last MMG (if hormonal med): n/a Refill authorized: rx pend.

## 2023-11-04 ENCOUNTER — Other Ambulatory Visit: Payer: Self-pay | Admitting: Obstetrics and Gynecology

## 2023-11-04 DIAGNOSIS — N946 Dysmenorrhea, unspecified: Secondary | ICD-10-CM

## 2023-11-06 NOTE — Telephone Encounter (Signed)
 Med refill request: Ibuprofen  (Advil ) 800 mg tablet Last AEX:  12/08/22 Next OV: 11/28/23 - Follow up visit Next AEX: Not yet scheduled Last filled: 08/21/23 - #60 tablets with 0 refills Refill authorized? Please advise.

## 2023-11-15 ENCOUNTER — Encounter: Payer: Self-pay | Admitting: Obstetrics and Gynecology

## 2023-11-15 NOTE — Telephone Encounter (Signed)
 We can leave a sample pack at the front desk if she can come get it?. Also- please resend to [facility] pharmacy. Slynd  is only $25 cash pay there.

## 2023-11-27 NOTE — Progress Notes (Unsigned)
 GYNECOLOGY  VISIT   HPI: 33 y.o.   Single  African American female   G0P0000 with Patient's last menstrual period was 11/12/2023 (exact date).   here for: annual exam with follow up - fibroids & Slynd .   Slynd  is controlling menstrual bleeding and cramping.  Still takes ibuprofen  as needed.   Has some PMS symptoms.  Feels irritated and heightened during this time of her month.  Took Zoloft  and did not feel well on it until she reduced the dosage.   Pelvic US  uterus 7.91 x 5.97 x 5.15 cm.  Fibroids intramural and subserosal:  1.04 cm, 1.14 cm, 0.63 cm, 1.24 cm, 1.66 cm, 1.00 cm, 2.98 cm.  The fibroid with is 2.51 cm is degenerating.  EMS 7.55 mm, thin, symmetrical. No endometrial masses.  Left ovary 2.49 x 1.47 x 1.67 cm.  Right ovary 4.35 x 3.9 x 1.80 cm.   19 x 15 mm resolving cyst, 5.6 x 4.6 mm echogenic area previously seen as a 4 mm echogenic area in 2020. No adnexal masses.  No free fluid.   Getting married at the end of this month.  Does not want to have her menstrual cycle.  Currently not employed.  Experiencing stress related to that.   Doing dancing and ballet.   GYNECOLOGIC HISTORY: Patient's last menstrual period was 11/12/2023 (exact date). Contraception:  condoms sometimes   Menopausal hormone therapy:  n/a Last 2 paps:  12/08/22 neg, HR HPV neg, 12/06/21 neg, HR HPV Neg History of abnormal Pap or positive HPV:  yes Mammogram:  n/a        OB History     Gravida  0   Para  0   Term  0   Preterm  0   AB  0   Living  0      SAB  0   IAB  0   Ectopic  0   Multiple  0   Live Births  0              Patient Active Problem List   Diagnosis Date Noted   Heart murmur 12/08/2022   Fibroids, submucosal 05/03/2018   Menorrhagia with regular cycle 05/03/2018    Past Medical History:  Diagnosis Date   Abnormal uterine bleeding    Anxiety    CIN I (cervical intraepithelial neoplasia I) 2021   Depression    Dysmenorrhea    Elevated  bilirubin    Fibroid uterus    Heart murmur    HSV-1 (herpes simplex virus 1) infection    Low vitamin D  level     Past Surgical History:  Procedure Laterality Date   NO PAST SURGERIES      Current Outpatient Medications  Medication Sig Dispense Refill   Cetirizine HCl (ZYRTEC PO) Take by mouth.     ibuprofen  (ADVIL ) 800 MG tablet TAKE 1 TABLET (800 MG TOTAL) BY MOUTH EVERY 8 (EIGHT) HOURS AS NEEDED FOR CRAMPING. 60 tablet 0   sertraline  (ZOLOFT ) 25 MG tablet Take 1 tablet (25 mg total) by mouth daily. Take for 2 weeks of the month when having symptoms. 14 tablet 11   Drospirenone  (SLYND ) 4 MG TABS Take 1 tablet (4 mg total) by mouth daily. 28 tablet 11   fluconazole  (DIFLUCAN ) 150 MG tablet Take 1 tablet PO once. Repeat in 3 days if needed. (Patient not taking: Reported on 11/28/2023) 2 tablet 0   metroNIDAZOLE  (FLAGYL ) 500 MG tablet Take 1 tablet (500 mg total)  by mouth 2 (two) times daily. (Patient not taking: Reported on 11/28/2023) 14 tablet 0   No current facility-administered medications for this visit.     ALLERGIES: Lactose intolerance (gi)  Family History  Problem Relation Age of Onset   Hypertension Father     Social History   Socioeconomic History   Marital status: Single    Spouse name: Not on file   Number of children: Not on file   Years of education: Not on file   Highest education level: Not on file  Occupational History   Not on file  Tobacco Use   Smoking status: Never   Smokeless tobacco: Never  Vaping Use   Vaping status: Never Used  Substance and Sexual Activity   Alcohol use: Yes    Alcohol/week: 2.0 standard drinks of alcohol    Types: 2 Glasses of wine per week    Comment: occ glass of wine   Drug use: Not Currently    Types: Marijuana    Comment: occ.   Sexual activity: Yes    Partners: Male    Birth control/protection: Condom, OCP    Comment: condoms sometimes  Other Topics Concern   Not on file  Social History Narrative   Not on  file   Social Drivers of Health   Financial Resource Strain: Not on file  Food Insecurity: Not on file  Transportation Needs: Not on file  Physical Activity: Not on file  Stress: Not on file  Social Connections: Unknown (07/27/2021)   Received from [facility]   Social Network    Social Network: Not on file  Intimate Partner Violence: Unknown (06/18/2021)   Received from [facility]   HITS    Physically Hurt: Not on file    Insult or Talk Down To: Not on file    Threaten Physical Harm: Not on file    Scream or Curse: Not on file    Review of Systems  See HPI.  PHYSICAL EXAMINATION:   BP 114/76 (BP Location: Left Arm, Patient Position: Sitting)   Pulse 83   Ht 5' 8 (1.727 m)   Wt 149 lb (67.6 kg)   LMP 11/12/2023 (Exact Date)   SpO2 99%   BMI 22.66 kg/m     General appearance: alert, cooperative and appears stated age Head: Normocephalic, without obvious abnormality, atraumatic Neck: no adenopathy, supple, symmetrical, trachea midline and thyroid normal to inspection and palpation Lungs: clear to auscultation bilaterally Breasts: normal appearance, no masses or tenderness, No nipple retraction or dimpling, No nipple discharge or bleeding, No axillary or supraclavicular adenopathy Heart: regular rate and rhythm Abdomen: soft, non-tender, no masses,  no organomegaly Extremities: extremities normal, atraumatic, no cyanosis or edema Skin: Skin color, texture, turgor normal. No rashes or lesions Lymph nodes: Cervical, supraclavicular, and axillary nodes normal. No abnormal inguinal nodes palpated Neurologic: Grossly normal  Pelvic: External genitalia:  no lesions              Urethra:  normal appearing urethra with no masses, tenderness or lesions              Bartholins and Skenes: normal                 Vagina: normal appearing vagina with normal color and discharge, no lesions              Cervix: no lesions                Bimanual Exam:  Uterus:  normal size,  contour, position, consistency, mobility, non-tender              Adnexa: no mass, fullness, tenderness           Chaperone was present for exam:  Kari HERO, CMA  ASSESSMENT: Well woman visit with GYN exam.  Fibroids. Intramural and subserosal.  Right ovarian cyst.  Resolving.  Small right ovarian echogenicity, stable.  Dysmenorrhea.  Hx LGSIL and positive HR HPV. Cervical cancer screening.  STD screening.  HSV I.  PMS.   PHQ9: 6  PLAN:  Self breast exam reviewed.  Pap and HR HPV collected.  Refill of Slynd  for one year.  We discussed how to take to avoid getting her period during her wedding.  Zoloft  25 mg daily for 2 weeks out of the month when her symptoms are most pronounced.  She will decide if she wants to start this prior to her wedding.   STD screening today.  Follow up in 3 - 4 months for a recheck on Zoloft  for PMS. I cautioned her about the use of Zoloft  and taking Ibuprofen  which can cause increased risk of gastrointestinal bleeding.  FU yearly for annual exam.  20 min  total time was spent for this patient encounter, including preparation, face-to-face counseling with the patient, coordination of care, and documentation of the encounter regarding PMS in addition to doing the well woman visit.

## 2023-11-28 ENCOUNTER — Encounter: Payer: Self-pay | Admitting: Obstetrics and Gynecology

## 2023-11-28 ENCOUNTER — Ambulatory Visit: Admitting: Obstetrics and Gynecology

## 2023-11-28 ENCOUNTER — Other Ambulatory Visit (HOSPITAL_COMMUNITY)
Admission: RE | Admit: 2023-11-28 | Discharge: 2023-11-28 | Disposition: A | Discharge status: Home / Self Care | Source: Ambulatory Visit | Attending: Obstetrics and Gynecology | Admitting: Obstetrics and Gynecology

## 2023-11-28 VITALS — BP 114/76 | HR 83 | Ht 68.0 in | Wt 149.0 lb

## 2023-11-28 DIAGNOSIS — N946 Dysmenorrhea, unspecified: Secondary | ICD-10-CM

## 2023-11-28 DIAGNOSIS — Z113 Encounter for screening for infections with a predominantly sexual mode of transmission: Secondary | ICD-10-CM | POA: Diagnosis not present

## 2023-11-28 DIAGNOSIS — Z124 Encounter for screening for malignant neoplasm of cervix: Secondary | ICD-10-CM | POA: Insufficient documentation

## 2023-11-28 DIAGNOSIS — Z01419 Encounter for gynecological examination (general) (routine) without abnormal findings: Secondary | ICD-10-CM | POA: Diagnosis not present

## 2023-11-28 DIAGNOSIS — N943 Premenstrual tension syndrome: Secondary | ICD-10-CM

## 2023-11-28 DIAGNOSIS — Z1159 Encounter for screening for other viral diseases: Secondary | ICD-10-CM

## 2023-11-28 DIAGNOSIS — Z1331 Encounter for screening for depression: Secondary | ICD-10-CM

## 2023-11-28 DIAGNOSIS — Z114 Encounter for screening for human immunodeficiency virus [HIV]: Secondary | ICD-10-CM

## 2023-11-28 MED ORDER — SLYND 4 MG PO TABS: 4.0000 mg | ORAL_TABLET | Freq: Every day | ORAL | 11 refills | Status: AC

## 2023-11-28 MED ORDER — SERTRALINE HCL 25 MG PO TABS: 25.0000 mg | ORAL_TABLET | Freq: Every day | ORAL | 11 refills | Status: DC

## 2023-11-28 NOTE — Addendum Note (Signed)
 Addended by: CATHLYN JAYSON CARY, BOBIE E on: 11/28/2023 08:15 PM   Modules accepted: Level of Service

## 2023-11-28 NOTE — Patient Instructions (Addendum)
 Premenstrual Syndrome (PMS): What to Know Premenstrual syndrome (PMS) is a group of symptoms that can affect people as part of their menstrual cycle. PMS usually happens several days before the start of a menstrual period and goes away a few days after bleeding begins. PMS symptoms can range from mild to very bad. What are the causes? The exact cause isn't known, but it's likely related to hormone changes that happen before your period. Hormones are chemicals that affect how your body works. What are the signs or symptoms? Physical symptoms include: Headaches. Bloating and weight gain. Swollen or painful breasts. Being very tired. Backache. Swelling of your hands and feet. Diarrhea, which is watery poop, or trouble pooping (constipation). Emotional symptoms include: Mood swings. Anxiety Depression. Angry or mean outbursts. Being easily annoyed (irritable). Crying. Behavioral symptoms include: Food cravings or appetite changes. Changes in sexual desire. Confusion. Social withdrawal. Not being able to focus well. Trouble sleeping. Symptoms often happen every month. Different people have different symptoms. How is this diagnosed? PMS may be diagnosed based on a history of your symptoms. PMS is generally diagnosed if symptoms: Happen 5 days before your period starts. End within 4 days after your period starts. Happen at least 3 months in a row. Get in the way of some of your usual activities. Other problems that can cause some of these symptoms must be ruled out before PMS can be diagnosed. These include depression, anxiety, anemia, and thyroid problems. How is this treated? PMS may be treated with: Having a healthy lifestyle. This includes eating healthy foods and exercising regularly. Medicines you can buy at the store. These may help relieve symptoms, such as: Cramps. Aches. Pain. Headaches. Breast tenderness. Other medicines if needed. If your symptoms are very bad, your  health care provider may prescribe medicines to help. Follow these instructions at home: Eating and drinking  Eat a well-balanced diet. Avoid caffeine and alcohol. Limit the amount of salty and sugary foods you eat. This may help reduce bloating. Drink more fluids as told. Take a multivitamin if told. Lifestyle  Do not smoke, vape, or use nicotine or tobacco. Exercise as told. Get enough sleep. For most adults, this is 7-8 hours of sleep each night. Practice relaxation techniques, such as: Yoga. Breathing exercises. Meditation. Find healthy ways to manage stress. General instructions  To help your provider choose the best treatment for you, for 2-3 months, write down: Your symptoms. Whether your symptoms are mild or very bad. How long your symptoms last. Take your medicines only as told. If you're using birth control pills, use them as told. Contact a health care provider if: Your symptoms get worse. You develop new symptoms. You have trouble doing your daily activities. This information is not intended to replace advice given to you by your health care provider. Make sure you discuss any questions you have with your health care provider. Document Revised: 12/18/2022 Document Reviewed: 12/18/2022 Elsevier Patient Education  2025 Elsevier Inc.  EXERCISE AND DIET:  We recommended that you start or continue a regular exercise program for good health. Regular exercise means any activity that makes your heart beat faster and makes you sweat.  We recommend exercising at least 30 minutes per day at least 3 days a week, preferably 4 or 5.  We also recommend a diet low in fat and sugar.  Inactivity, poor dietary choices and obesity can cause diabetes, heart attack, stroke, and kidney damage, among others.    ALCOHOL AND SMOKING:  Women should  limit their alcohol intake to no more than 7 drinks/beers/glasses of wine (combined, not each!) per week. Moderation of alcohol intake to this  level decreases your risk of breast cancer and liver damage. And of course, no recreational drugs are part of a healthy lifestyle.  And absolutely no smoking or even second hand smoke. Most people know smoking can cause heart and lung diseases, but did you know it also contributes to weakening of your bones? Aging of your skin?  Yellowing of your teeth and nails?  CALCIUM AND VITAMIN D :  Adequate intake of calcium and Vitamin D  are recommended.  The recommendations for exact amounts of these supplements seem to change often, but generally speaking 600 mg of calcium (either carbonate or citrate) and 800 units of Vitamin D  per day seems prudent. Certain women may benefit from higher intake of Vitamin D .  If you are among these women, your doctor will have told you during your visit.    PAP SMEARS:  Pap smears, to check for cervical cancer or precancers,  have traditionally been done yearly, although recent scientific advances have shown that most women can have pap smears less often.  However, every woman still should have a physical exam from her gynecologist every year. It will include a breast check, inspection of the vulva and vagina to check for abnormal growths or skin changes, a visual exam of the cervix, and then an exam to evaluate the size and shape of the uterus and ovaries.  And after 35 years of age, a rectal exam is indicated to check for rectal cancers. We will also provide age appropriate advice regarding health maintenance, like when you should have certain vaccines, screening for sexually transmitted diseases, bone density testing, colonoscopy, mammograms, etc.   MAMMOGRAMS:  All women over 36 years old should have a yearly mammogram. Many facilities now offer a 3D mammogram, which may cost around $50 extra out of pocket. If possible,  we recommend you accept the option to have the 3D mammogram performed.  It both reduces the number of women who will be called back for extra views which then  turn out to be normal, and it is better than the routine mammogram at detecting truly abnormal areas.    COLONOSCOPY:  Colonoscopy to screen for colon cancer is recommended for all women at age 15.  We know, you hate the idea of the prep.  We agree, BUT, having colon cancer and not knowing it is worse!!  Colon cancer so often starts as a polyp that can be seen and removed at colonscopy, which can quite literally save your life!  And if your first colonoscopy is normal and you have no family history of colon cancer, most women don't have to have it again for 10 years.  Once every ten years, you can do something that may end up saving your life, right?  We will be happy to help you get it scheduled when you are ready.  Be sure to check your insurance coverage so you understand how much it will cost.  It may be covered as a preventative service at no cost, but you should check your particular policy.

## 2023-11-29 ENCOUNTER — Encounter: Payer: Self-pay | Admitting: Obstetrics and Gynecology

## 2023-11-29 LAB — HIV ANTIBODY (ROUTINE TESTING W REFLEX)
HIV 1&2 Ab, 4th Generation: NONREACTIVE
HIV FINAL INTERPRETATION: NEGATIVE

## 2023-11-29 LAB — RPR: RPR Ser Ql: NONREACTIVE

## 2023-11-29 LAB — HEPATITIS C ANTIBODY: Hepatitis C Ab: NONREACTIVE

## 2023-11-30 LAB — CYTOLOGY - PAP
Chlamydia: NEGATIVE
Comment: NEGATIVE
Comment: NEGATIVE
Comment: NEGATIVE
Comment: NORMAL
Diagnosis: NEGATIVE
High risk HPV: NEGATIVE
Neisseria Gonorrhea: NEGATIVE
Trichomonas: NEGATIVE

## 2023-12-07 ENCOUNTER — Ambulatory Visit: Payer: Self-pay | Admitting: Obstetrics and Gynecology

## 2023-12-25 ENCOUNTER — Other Ambulatory Visit: Payer: Self-pay | Admitting: Obstetrics and Gynecology

## 2023-12-25 NOTE — Telephone Encounter (Signed)
 Medication refill request: sertraline  25mg  (PMS) Last AEX:  11-28-23 Next AEX: 11-28-24 Last MMG (if hormonal medication request): none Refill authorized: last sent to pharmacy on 11-28-23 #14 with 11 refills. Pharmacy is requesting 90 day supply instead. Please approve or deny as appropriate

## 2023-12-26 NOTE — Telephone Encounter (Signed)
 Labs on 11/28/23

## 2024-01-05 ENCOUNTER — Other Ambulatory Visit: Payer: Self-pay | Admitting: Obstetrics and Gynecology

## 2024-01-05 DIAGNOSIS — N946 Dysmenorrhea, unspecified: Secondary | ICD-10-CM

## 2024-01-05 NOTE — Telephone Encounter (Signed)
 Med refill request: ibuprofen  800 Last AEX: 11/28/23 Next AEX: 11/28/24 Last MMG (if hormonal med) Refill authorized: last rx 11/06/23 #60 with 0 refills. Please approve or deny

## 2024-01-31 ENCOUNTER — Other Ambulatory Visit: Payer: Self-pay | Admitting: Obstetrics and Gynecology

## 2024-01-31 NOTE — Telephone Encounter (Signed)
 Med refill request:    sertraline  (ZOLOFT ) 25 MG tablet  Start:  12/25/23 Disp:  42 tablets Refills:  0  Last AEX:  02/27/24   Next AEX:  11/27/24 Last MMG (if hormonal med):  N/A Refill authorized? Please Advise.

## 2024-02-05 ENCOUNTER — Encounter: Payer: Self-pay | Admitting: Obstetrics and Gynecology

## 2024-02-05 ENCOUNTER — Other Ambulatory Visit: Payer: Self-pay | Admitting: Obstetrics and Gynecology

## 2024-02-05 DIAGNOSIS — N946 Dysmenorrhea, unspecified: Secondary | ICD-10-CM

## 2024-02-05 NOTE — Telephone Encounter (Signed)
 Med refill request:   ibuprofen  (ADVIL ) 800 MG tablet  Start:  11/06/23 Disp:  60 tablets Refills:  0  Last AEX:  11/28/23 Next OV:  02/27/24 Next AEX:  11/28/24 Last MMG (if hormonal med):  N/A Refill authorized? Please Advise.

## 2024-02-27 ENCOUNTER — Ambulatory Visit: Admitting: Obstetrics and Gynecology

## 2024-02-27 ENCOUNTER — Encounter: Payer: Self-pay | Admitting: Obstetrics and Gynecology

## 2024-02-27 ENCOUNTER — Other Ambulatory Visit (HOSPITAL_COMMUNITY)
Admission: RE | Admit: 2024-02-27 | Discharge: 2024-02-27 | Disposition: A | Source: Ambulatory Visit | Attending: Obstetrics and Gynecology | Admitting: Obstetrics and Gynecology

## 2024-02-27 VITALS — BP 114/82 | HR 66 | Ht 67.5 in | Wt 149.0 lb

## 2024-02-27 DIAGNOSIS — N898 Other specified noninflammatory disorders of vagina: Secondary | ICD-10-CM | POA: Insufficient documentation

## 2024-02-27 DIAGNOSIS — L659 Nonscarring hair loss, unspecified: Secondary | ICD-10-CM

## 2024-02-27 DIAGNOSIS — R5383 Other fatigue: Secondary | ICD-10-CM | POA: Diagnosis not present

## 2024-02-27 DIAGNOSIS — Z3169 Encounter for other general counseling and advice on procreation: Secondary | ICD-10-CM | POA: Diagnosis not present

## 2024-02-27 DIAGNOSIS — N926 Irregular menstruation, unspecified: Secondary | ICD-10-CM | POA: Diagnosis not present

## 2024-02-27 LAB — PREGNANCY, URINE: Preg Test, Ur: NEGATIVE

## 2024-02-27 NOTE — Progress Notes (Signed)
 GYNECOLOGY  VISIT   HPI: 33 y.o.   Single  African American female   G0P0000 with No LMP recorded (lmp unknown).   here for: Follow up for a recheck on Zoloft  for PMS.  Has not been taking the Zoloft . Thinks she could possibly be pregnant. Has not had a period.     Has skipped her period for her wedding by taking continuous pills.  Did have some breakthrough bleeding.    Considering pregnancy possible in about one year.   Not taking Zoloft  because she takes ibuprofen  on a somewhat regular basis for pelvic pain and dysmenorrhea.   Had pelvic US  05/25/23 showing fibroids, resolving right ovarian cyst, and 5.6 x 4.6 mm echogenic area of right ovary.   Has some white discharge and wants testing for vaginitis.  States she wants to take better care for herself.   Has stress.  Hair loss.  Wants blood work done.   Married in September. 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  UPT negative today.   GYNECOLOGIC HISTORY: No LMP recorded (lmp unknown). Contraception:  Slynd  Menopausal hormone therapy:  n/a Last 2 paps:  11/28/23 neg, HR HPV neg, 12/08/22 neg, HR HPV neg History of abnormal Pap or positive HPV:  yes Mammogram:  n/a        OB History     Gravida  0   Para  0   Term  0   Preterm  0   AB  0   Living  0      SAB  0   IAB  0   Ectopic  0   Multiple  0   Live Births  0              Patient Active Problem List   Diagnosis Date Noted   Heart murmur 12/08/2022   Fibroids, submucosal 05/03/2018   Menorrhagia with regular cycle 05/03/2018    Past Medical History:  Diagnosis Date   Abnormal uterine bleeding    Anxiety    CIN I (cervical intraepithelial neoplasia I) 2021   Depression    Dysmenorrhea    Elevated bilirubin    Fibroid uterus    Heart murmur    HSV-1 (herpes simplex virus 1) infection    Low vitamin D  level     Past Surgical History:  Procedure Laterality Date   NO PAST SURGERIES      Current Outpatient Medications   Medication Sig Dispense Refill   Cetirizine HCl (ZYRTEC PO) Take by mouth.     Drospirenone  (SLYND ) 4 MG TABS Take 1 tablet (4 mg total) by mouth daily. 28 tablet 11   ibuprofen  (ADVIL ) 800 MG tablet TAKE 1 TABLET (800 MG TOTAL) BY MOUTH EVERY 8 (EIGHT) HOURS AS NEEDED FOR CRAMPING. 30 tablet 0   sertraline  (ZOLOFT ) 25 MG tablet TAKE 1 TABLET BY MOUTH DAILY. TAKE FOR 2 WEEKS OF THE MONTH WHEN HAVING SYMPTOMS. (Patient not taking: Reported on 02/27/2024) 42 tablet 0   No current facility-administered medications for this visit.     ALLERGIES: Lactose intolerance (gi)  Family History  Problem Relation Age of Onset   Hypertension Father     Social History   Socioeconomic History   Marital status: Single    Spouse name: Not on file   Number of children: Not on file   Years of education: Not on file   Highest education level: Not on file  Occupational History   Not on file  Tobacco Use  Smoking status: Never   Smokeless tobacco: Never  Vaping Use   Vaping status: Never Used  Substance and Sexual Activity   Alcohol use: Yes    Alcohol/week: 2.0 standard drinks of alcohol    Types: 2 Glasses of wine per week    Comment: occ glass of wine   Drug use: Not Currently    Types: Marijuana    Comment: occ.   Sexual activity: Yes    Partners: Male    Birth control/protection: Condom, OCP    Comment: condoms sometimes  Other Topics Concern   Not on file  Social History Narrative   Not on file   Social Drivers of Health   Tobacco Use: Low Risk (02/27/2024)   Patient History    Smoking Tobacco Use: Never    Smokeless Tobacco Use: Never    Passive Exposure: Not on file  Financial Resource Strain: Not on file  Food Insecurity: Not on file  Transportation Needs: Not on file  Physical Activity: Not on file  Stress: Not on file  Social Connections: Unknown (07/27/2021)   Received from [facility]   Social Network    Social Network: Not on file  Intimate Partner Violence:  Unknown (06/18/2021)   Received from [facility]   HITS    Physically Hurt: Not on file    Insult or Talk Down To: Not on file    Threaten Physical Harm: Not on file    Scream or Curse: Not on file  Depression (PHQ2-9): Medium Risk (11/28/2023)   Depression (PHQ2-9)    PHQ-2 Score: 6  Alcohol Screen: Not on file  Housing: Not on file  Utilities: Not on file  Health Literacy: Not on file    Review of Systems  All other systems reviewed and are negative.   PHYSICAL EXAMINATION:   BP 114/82 (BP Location: Left Arm, Patient Position: Sitting)   Pulse 66   Ht 5' 7.5 (1.715 m)   Wt 149 lb (67.6 kg)   LMP  (LMP Unknown)   SpO2 100%   BMI 22.99 kg/m     General appearance: alert, cooperative and appears stated age   Pelvic: External genitalia:  no lesions              Urethra:  normal appearing urethra with no masses, tenderness or lesions              Bartholins and Skenes: normal                 Vagina: normal appearing vagina with normal color and discharge, no lesions              Cervix: no lesions                Bimanual Exam:  Uterus:  normal size, contour, position, consistency, mobility, non-tender              Adnexa: no mass, fullness, tenderness   Chaperone was present for exam:  Kari HERO, CMA  ASSESSMENT:  Missed period.  Preconception counseling.  Fatigue.   Hair loss.  Vaginal discharge.  PLAN:  Serum quant hCG.  We discussed avoidance of exposures when trying for pregnancy:  tobacco, ETOH, Ibuprofen , unnecessary medications, changing cat litter box.  Will check Rubella titer.  Reading about pregnancy planning recommended:  What to Expect Before You're Expecting.  Will check TSH, CBC, iron, ferritin, vit D, vit B12, testosterone levels.  Nuswab vaginitis testing.  She will look for a  PCP closer to her home.  We discussed a health coach may be available through her work or her insurance or both.  Follow up for annual exam and prn.   35 min  total time  was spent for this patient encounter, including preparation, face-to-face counseling with the patient, coordination of care, and documentation of the encounter.

## 2024-02-28 LAB — CERVICOVAGINAL ANCILLARY ONLY
Bacterial Vaginitis (gardnerella): POSITIVE — AB
Candida Glabrata: NEGATIVE
Candida Vaginitis: POSITIVE — AB
Comment: NEGATIVE
Comment: NEGATIVE
Comment: NEGATIVE
Comment: NEGATIVE
Trichomonas: NEGATIVE

## 2024-02-29 ENCOUNTER — Encounter: Payer: Self-pay | Admitting: Obstetrics and Gynecology

## 2024-03-01 ENCOUNTER — Ambulatory Visit: Payer: Self-pay | Admitting: Obstetrics and Gynecology

## 2024-03-01 ENCOUNTER — Telehealth: Admitting: Emergency Medicine

## 2024-03-01 DIAGNOSIS — N760 Acute vaginitis: Secondary | ICD-10-CM | POA: Diagnosis not present

## 2024-03-01 MED ORDER — FLUCONAZOLE 150 MG PO TABS: ORAL_TABLET | ORAL | 0 refills | Status: AC

## 2024-03-01 MED ORDER — METRONIDAZOLE 500 MG PO TABS: 500.0000 mg | ORAL_TABLET | Freq: Two times a day (BID) | ORAL | 0 refills | Status: AC

## 2024-03-01 NOTE — Progress Notes (Signed)
 We are sorry that you are not feeling well. Here is how we plan to help! Based on what you shared with me it looks like you: May have a vaginosis due to bacteria  Vaginosis is an inflammation of the vagina that can result in discharge, itching and pain. The cause is usually a change in the normal balance of vaginal bacteria or an infection. Vaginosis can also result from reduced estrogen levels after menopause.  The most common causes of vaginosis are:   Bacterial vaginosis which results from an overgrowth of one on several organisms that are normally present in your vagina.   Yeast infections which are caused by a naturally occurring fungus called candida.   Vaginal atrophy (atrophic vaginosis) which results from the thinning of the vagina from reduced estrogen levels after menopause.   Trichomoniasis which is caused by a parasite and is commonly transmitted by sexual intercourse.  Factors that increase your risk of developing vaginosis include: Medications, such as antibiotics and steroids Uncontrolled diabetes Use of hygiene products such as bubble bath, vaginal spray or vaginal deodorant Douching Wearing damp or tight-fitting clothing Using an intrauterine device (IUD) for birth control Hormonal changes, such as those associated with pregnancy, birth control pills or menopause Sexual activity Having a sexually transmitted infection  Your treatment plan is Metronidazole  or Flagyl  500mg  twice a day for 7 days.  I have electronically sent this prescription into the pharmacy that you have chosen.  Be sure to take all of the medication as directed. Stop taking any medication if you develop a rash, tongue swelling or shortness of breath. Mothers who are breast feeding should consider pumping and discarding their breast milk while on these antibiotics. However, there is no consensus that infant exposure at these doses would be harmful.  Remember that medication creams can weaken latex  condoms.   HOME CARE:  Good hygiene may prevent some types of vaginosis from recurring and may relieve some symptoms:  Avoid baths, hot tubs and whirlpool spas. Rinse soap from your outer genital area after a shower, and dry the area well to prevent irritation. Don't use scented or harsh soaps, such as those with deodorant or antibacterial action. Avoid irritants. These include scented tampons and pads. Wipe from front to back after using the toilet. Doing so avoids spreading fecal bacteria to your vagina.  Other things that may help prevent vaginosis include:  Don't douche. Your vagina doesn't require cleansing other than normal bathing. Repetitive douching disrupts the normal organisms that reside in the vagina and can actually increase your risk of vaginal infection. Douching won't clear up a vaginal infection. Use a latex condom. Both female and female latex condoms may help you avoid infections spread by sexual contact. Wear cotton underwear. Also wear pantyhose with a cotton crotch. If you feel comfortable without it, skip wearing underwear to bed. Yeast thrives in [place] Your symptoms should improve in the next day or two.  GET HELP RIGHT AWAY IF:  You have pain in your lower abdomen ( pelvic area or over your ovaries) You develop nausea or vomiting You develop a fever Your discharge changes or worsens You have persistent pain with intercourse You develop shortness of breath, a rapid pulse, or you faint.  These symptoms could be signs of problems or infections that need to be evaluated by a medical provider now.  MAKE SURE YOU   Understand these instructions. Will watch your condition. Will get help right away if you are not doing  well or get worse.  Your e-visit answers were reviewed by a board certified advanced clinical practitioner to complete your personal care plan. Depending upon the condition, your plan could have included both over the counter or  prescription medications. Please review your pharmacy choice to make sure that you have choses a pharmacy that is open for you to pick up any needed prescription, Your safety is important to us . If you have drug allergies check your prescription carefully.   You can use MyChart to ask questions about today's visit, request a non-urgent call back, or ask for a work or school excuse for 24 hours related to this e-Visit. If it has been greater than 24 hours you will need to follow up with your provider, or enter a new e-Visit to address those concerns. You will get a MyChart message within the next two days asking about your experience. I hope that your e-visit has been valuable and will speed your recovery.  I have spent 5 minutes in review of e-visit questionnaire, review and updating patient chart, medical decision making and response to patient.   Lamar Schlossman, PA-C

## 2024-03-01 NOTE — Addendum Note (Signed)
 Addended by: VICKY CHARLESTON B on: 03/01/2024 09:56 AM   Modules accepted: Orders

## 2024-03-04 LAB — CBC
HCT: 39.6 % (ref 35.9–46.0)
Hemoglobin: 13.2 g/dL (ref 11.7–15.5)
MCH: 34.1 pg — ABNORMAL HIGH (ref 27.0–33.0)
MCHC: 33.3 g/dL (ref 31.6–35.4)
MCV: 102.3 fL — ABNORMAL HIGH (ref 81.4–101.7)
MPV: 10.9 fL (ref 7.5–12.5)
Platelets: 238 Thousand/uL (ref 140–400)
RBC: 3.87 Million/uL (ref 3.80–5.10)
RDW: 10.9 % — ABNORMAL LOW (ref 11.0–15.0)
WBC: 6.4 Thousand/uL (ref 3.8–10.8)

## 2024-03-04 LAB — TESTOS,TOTAL,FREE AND SHBG (FEMALE)
Free Testosterone: 1.9 pg/mL (ref 0.1–6.4)
Sex Hormone Binding: 110 nmol/L (ref 17–124)
Testosterone, Total, LC-MS-MS: 24 ng/dL (ref 2–45)

## 2024-03-04 LAB — IRON: Iron: 79 ug/dL (ref 40–190)

## 2024-03-04 LAB — VITAMIN B12: Vitamin B-12: 1262 pg/mL — ABNORMAL HIGH (ref 200–1100)

## 2024-03-04 LAB — RUBELLA SCREEN: Rubella: 3.28 {index}

## 2024-03-04 LAB — FERRITIN: Ferritin: 63 ng/mL (ref 16–154)

## 2024-03-04 LAB — HCG, QUANTITATIVE, PREGNANCY: HCG, Total, QN: <5 m[IU]/mL

## 2024-03-04 LAB — TSH: TSH: 1.21 m[IU]/L

## 2024-03-04 LAB — VITAMIN D 25 HYDROXY (VIT D DEFICIENCY, FRACTURES): Vit D, 25-Hydroxy: 42 ng/mL (ref 30–100)

## 2024-03-18 ENCOUNTER — Other Ambulatory Visit: Payer: Self-pay | Admitting: Obstetrics and Gynecology

## 2024-03-18 DIAGNOSIS — N946 Dysmenorrhea, unspecified: Secondary | ICD-10-CM

## 2024-03-19 ENCOUNTER — Encounter: Payer: Self-pay | Admitting: Obstetrics and Gynecology

## 2024-03-19 ENCOUNTER — Other Ambulatory Visit: Payer: Self-pay | Admitting: Obstetrics and Gynecology

## 2024-03-19 DIAGNOSIS — N946 Dysmenorrhea, unspecified: Secondary | ICD-10-CM

## 2024-03-19 NOTE — Telephone Encounter (Signed)
 Medication refill request: ibuprofen  800mg  Last AEX:  11-28-23 Next AEX: 11-28-24 Last MMG (if hormonal medication request): n/a Refill authorized: last filled 02-05-24 #30 with 0 refills. Please approve or deny as appropriate

## 2024-03-20 ENCOUNTER — Other Ambulatory Visit: Payer: Self-pay | Admitting: Obstetrics and Gynecology

## 2024-03-20 ENCOUNTER — Encounter: Payer: Self-pay | Admitting: Obstetrics and Gynecology

## 2024-03-20 DIAGNOSIS — N946 Dysmenorrhea, unspecified: Secondary | ICD-10-CM

## 2024-03-20 MED ORDER — IBUPROFEN 800 MG PO TABS: 800.0000 mg | ORAL_TABLET | Freq: Three times a day (TID) | ORAL | 0 refills | Status: AC | PRN

## 2024-03-20 NOTE — Telephone Encounter (Signed)
 This is a second request for refill of ibuprofen .  Patient has sent 2 MyChart messages f/u on request.   Dr. Nikki -I can not find the reason Rx was refused on 03/19/24, please advise.

## 2024-03-20 NOTE — Telephone Encounter (Signed)
 Multiple messages and refill request received.   Will close this encounter.

## 2024-11-28 ENCOUNTER — Ambulatory Visit: Admitting: Obstetrics and Gynecology
